# Patient Record
Sex: Female | Born: 1965 | Race: White | Hispanic: No | Smoking: Never smoker
Health system: Southern US, Community
[De-identification: ages and names within clinical notes are randomized; demographics above are authoritative.]

## PROBLEM LIST (undated history)

## (undated) DIAGNOSIS — D649 Anemia, unspecified: Secondary | ICD-10-CM

## (undated) DIAGNOSIS — J45909 Unspecified asthma, uncomplicated: Secondary | ICD-10-CM

## (undated) DIAGNOSIS — L209 Atopic dermatitis, unspecified: Secondary | ICD-10-CM

## (undated) DIAGNOSIS — R51 Headache: Secondary | ICD-10-CM

## (undated) DIAGNOSIS — T7840XA Allergy, unspecified, initial encounter: Secondary | ICD-10-CM

## (undated) DIAGNOSIS — Z8489 Family history of other specified conditions: Secondary | ICD-10-CM

## (undated) DIAGNOSIS — M1990 Unspecified osteoarthritis, unspecified site: Secondary | ICD-10-CM

## (undated) DIAGNOSIS — G4730 Sleep apnea, unspecified: Secondary | ICD-10-CM

## (undated) DIAGNOSIS — Z9981 Dependence on supplemental oxygen: Secondary | ICD-10-CM

## (undated) DIAGNOSIS — R519 Headache, unspecified: Secondary | ICD-10-CM

## (undated) DIAGNOSIS — I10 Essential (primary) hypertension: Secondary | ICD-10-CM

## (undated) DIAGNOSIS — U071 COVID-19: Secondary | ICD-10-CM

## (undated) HISTORY — DX: Morbid (severe) obesity due to excess calories: E66.01

## (undated) HISTORY — DX: Allergy, unspecified, initial encounter: T78.40XA

## (undated) HISTORY — DX: COVID-19: U07.1

## (undated) HISTORY — DX: Sleep apnea, unspecified: G47.30

## (undated) HISTORY — DX: Dependence on supplemental oxygen: Z99.81

## (undated) HISTORY — DX: Unspecified asthma, uncomplicated: J45.909

## (undated) HISTORY — DX: Unspecified osteoarthritis, unspecified site: M19.90

## (undated) HISTORY — DX: Atopic dermatitis, unspecified: L20.9

## (undated) HISTORY — DX: Anemia, unspecified: D64.9

## (undated) HISTORY — PX: IUD REMOVAL: SHX5392

---

## 2005-01-20 ENCOUNTER — Ambulatory Visit: Payer: Self-pay

## 2006-03-08 ENCOUNTER — Ambulatory Visit: Payer: Self-pay

## 2007-04-06 ENCOUNTER — Ambulatory Visit: Payer: Self-pay

## 2008-04-10 ENCOUNTER — Ambulatory Visit: Payer: Self-pay

## 2012-05-23 ENCOUNTER — Ambulatory Visit: Payer: Self-pay | Admitting: Specialist

## 2012-05-23 DIAGNOSIS — Z01810 Encounter for preprocedural cardiovascular examination: Secondary | ICD-10-CM

## 2012-05-23 LAB — CBC WITH DIFFERENTIAL/PLATELET
Basophil %: 0.8 %
HCT: 42.3 % (ref 35.0–47.0)
Lymphocyte #: 1.3 10*3/uL (ref 1.0–3.6)
Lymphocyte %: 15.3 %
MCH: 25.1 pg — ABNORMAL LOW (ref 26.0–34.0)
MCV: 80 fL (ref 80–100)
Monocyte #: 0.4 x10 3/mm (ref 0.2–0.9)
Neutrophil #: 6.8 10*3/uL — ABNORMAL HIGH (ref 1.4–6.5)
Neutrophil %: 77.8 %
Platelet: 209 10*3/uL (ref 150–440)
RDW: 17.6 % — ABNORMAL HIGH (ref 11.5–14.5)
WBC: 8.8 10*3/uL (ref 3.6–11.0)

## 2012-05-23 LAB — COMPREHENSIVE METABOLIC PANEL
Albumin: 4.2 g/dL (ref 3.4–5.0)
Alkaline Phosphatase: 133 U/L (ref 50–136)
Anion Gap: 7 (ref 7–16)
BUN: 18 mg/dL (ref 7–18)
Calcium, Total: 9 mg/dL (ref 8.5–10.1)
Chloride: 105 mmol/L (ref 98–107)
Creatinine: 0.66 mg/dL (ref 0.60–1.30)
Osmolality: 277 (ref 275–301)
Sodium: 138 mmol/L (ref 136–145)

## 2012-05-23 LAB — HEMOGLOBIN A1C: Hemoglobin A1C: 5.9 % (ref 4.2–6.3)

## 2012-05-23 LAB — MAGNESIUM: Magnesium: 1.8 mg/dL

## 2012-05-23 LAB — AMYLASE: Amylase: 25 U/L (ref 25–115)

## 2012-05-23 LAB — IRON AND TIBC
Iron Bind.Cap.(Total): 428 ug/dL (ref 250–450)
Iron: 44 ug/dL — ABNORMAL LOW (ref 50–170)

## 2012-05-23 LAB — BILIRUBIN, DIRECT: Bilirubin, Direct: 0.2 mg/dL (ref 0.00–0.20)

## 2012-05-23 LAB — PROTIME-INR: Prothrombin Time: 13.3 secs (ref 11.5–14.7)

## 2012-05-23 LAB — APTT: Activated PTT: 28.7 secs (ref 23.6–35.9)

## 2012-05-23 LAB — FERRITIN: Ferritin (ARMC): 27 ng/mL (ref 8–388)

## 2012-05-25 ENCOUNTER — Ambulatory Visit: Payer: Self-pay | Admitting: Specialist

## 2012-05-28 ENCOUNTER — Ambulatory Visit: Payer: Self-pay | Admitting: Specialist

## 2012-05-28 HISTORY — PX: CHOLECYSTECTOMY: SHX55

## 2012-07-20 ENCOUNTER — Ambulatory Visit: Payer: Self-pay | Admitting: Specialist

## 2012-07-28 ENCOUNTER — Ambulatory Visit: Payer: Self-pay | Admitting: Specialist

## 2012-09-05 ENCOUNTER — Ambulatory Visit: Payer: Self-pay | Admitting: Obstetrics and Gynecology

## 2012-11-28 DIAGNOSIS — E66813 Obesity, class 3: Secondary | ICD-10-CM

## 2012-11-28 HISTORY — DX: Morbid (severe) obesity due to excess calories: E66.01

## 2012-11-28 HISTORY — DX: Obesity, class 3: E66.813

## 2012-11-28 HISTORY — PX: GASTRIC BYPASS: SHX52

## 2012-12-05 DIAGNOSIS — K449 Diaphragmatic hernia without obstruction or gangrene: Secondary | ICD-10-CM | POA: Insufficient documentation

## 2012-12-05 DIAGNOSIS — G4733 Obstructive sleep apnea (adult) (pediatric): Secondary | ICD-10-CM | POA: Insufficient documentation

## 2013-01-09 ENCOUNTER — Ambulatory Visit: Payer: Self-pay

## 2013-01-16 ENCOUNTER — Ambulatory Visit: Payer: Self-pay | Admitting: Specialist

## 2013-01-28 ENCOUNTER — Ambulatory Visit: Payer: Self-pay | Admitting: Specialist

## 2013-05-09 ENCOUNTER — Other Ambulatory Visit: Payer: Self-pay | Admitting: Specialist

## 2013-05-09 LAB — COMPREHENSIVE METABOLIC PANEL
ALBUMIN: 3.6 g/dL (ref 3.4–5.0)
ALT: 25 U/L (ref 12–78)
AST: 14 U/L — AB (ref 15–37)
Alkaline Phosphatase: 117 U/L
Anion Gap: 5 — ABNORMAL LOW (ref 7–16)
BILIRUBIN TOTAL: 0.5 mg/dL (ref 0.2–1.0)
BUN: 24 mg/dL — ABNORMAL HIGH (ref 7–18)
Calcium, Total: 8.7 mg/dL (ref 8.5–10.1)
Chloride: 105 mmol/L (ref 98–107)
Co2: 30 mmol/L (ref 21–32)
Creatinine: 0.79 mg/dL (ref 0.60–1.30)
EGFR (African American): >60
EGFR (Non-African Amer.): >60
Glucose: 86 mg/dL (ref 65–99)
Osmolality: 282 (ref 275–301)
Potassium: 3.8 mmol/L (ref 3.5–5.1)
Sodium: 140 mmol/L (ref 136–145)
TOTAL PROTEIN: 7.9 g/dL (ref 6.4–8.2)

## 2013-05-09 LAB — CBC WITH DIFFERENTIAL/PLATELET
BASOS ABS: 0.1 10*3/uL (ref 0.0–0.1)
Basophil %: 1.1 %
EOS ABS: 0.1 10*3/uL (ref 0.0–0.7)
Eosinophil %: 1.2 %
HCT: 41.5 % (ref 35.0–47.0)
HGB: 13.4 g/dL (ref 12.0–16.0)
LYMPHS ABS: 2.7 10*3/uL (ref 1.0–3.6)
LYMPHS PCT: 24.8 %
MCH: 27.8 pg (ref 26.0–34.0)
MCHC: 32.3 g/dL (ref 32.0–36.0)
MCV: 86 fL (ref 80–100)
MONOS PCT: 3.6 %
Monocyte #: 0.4 x10 3/mm (ref 0.2–0.9)
NEUTROS ABS: 7.6 10*3/uL — AB (ref 1.4–6.5)
NEUTROS PCT: 69.3 %
Platelet: 180 10*3/uL (ref 150–440)
RBC: 4.83 10*6/uL (ref 3.80–5.20)
RDW: 15.3 % — ABNORMAL HIGH (ref 11.5–14.5)
WBC: 10.9 10*3/uL (ref 3.6–11.0)

## 2013-05-09 LAB — PHOSPHORUS: PHOSPHORUS: 3.9 mg/dL (ref 2.5–4.9)

## 2013-05-09 LAB — FERRITIN: Ferritin (ARMC): 50 ng/mL (ref 8–388)

## 2013-05-09 LAB — FOLATE: Folic Acid: 5.7 ng/mL (ref 3.1–100.0)

## 2013-05-09 LAB — IRON: Iron: 31 ug/dL — ABNORMAL LOW (ref 50–170)

## 2013-05-09 LAB — MAGNESIUM: Magnesium: 2 mg/dL

## 2013-05-09 LAB — AMYLASE: Amylase: 30 U/L (ref 25–115)

## 2013-09-11 ENCOUNTER — Ambulatory Visit: Payer: Self-pay | Admitting: Family Medicine

## 2014-02-16 ENCOUNTER — Encounter: Payer: Self-pay | Admitting: Podiatry

## 2014-02-16 ENCOUNTER — Ambulatory Visit (INDEPENDENT_AMBULATORY_CARE_PROVIDER_SITE_OTHER): Payer: BC Managed Care – PPO | Admitting: Podiatry

## 2014-02-16 VITALS — BP 124/77 | HR 74 | Resp 16 | Ht 62.5 in | Wt 263.0 lb

## 2014-02-16 DIAGNOSIS — L84 Corns and callosities: Secondary | ICD-10-CM

## 2014-02-16 DIAGNOSIS — M779 Enthesopathy, unspecified: Secondary | ICD-10-CM

## 2014-02-16 MED ORDER — TRIAMCINOLONE ACETONIDE 10 MG/ML IJ SUSP
10.0000 mg | Freq: Once | INTRAMUSCULAR | Status: AC
  Administered 2014-02-16: 10 mg

## 2014-02-16 NOTE — Progress Notes (Signed)
   Subjective:    Patient ID: Claudia Hoffman, female    DOB: Dec 20, 1965, 48 y.o.   MRN: [id number]  HPI Comments: Spot on the bottom of the foot , 5th met , it burns and stings when walking on it   Foot Pain      Review of Systems  All other systems reviewed and are negative.      Objective:   Physical Exam        Assessment & Plan:

## 2014-02-17 NOTE — Progress Notes (Signed)
Subjective:     Patient ID: Claudia Hoffman, female   DOB: 03-07-1966, 48 y.o.   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      Objective:   Physical Exam  Constitutional: She is oriented to person, place, and time.  Cardiovascular: Intact distal pulses.   Musculoskeletal: Normal range of motion.  Neurological: She is oriented to person, place, and time.  Skin: Skin is warm.  Nursing note and vitals reviewed.  neurovascular status intact with muscle strength adequate and range of motion within normal limits subtalar and midtarsal joint. Patient is noted to have a keratotic lesion distal plantar aspect right fifth metatarsal that's painful when pressed with a lucent-type core     Assessment:     Probable porokeratotic lesion right with inflammatory capsulitis occurring around the fifth MPJ    Plan:     H&P and condition reviewed. Did a careful plantar steroid injection 3 mg Kenalog 5 mg Xylocaine and then did deep debridement of lesion which was tolerated well. Reappoint to recheck if symptoms persist

## 2014-02-26 ENCOUNTER — Ambulatory Visit: Payer: Self-pay | Admitting: Podiatry

## 2014-08-08 ENCOUNTER — Ambulatory Visit: Payer: Self-pay | Admitting: Podiatry

## 2014-08-10 ENCOUNTER — Ambulatory Visit (INDEPENDENT_AMBULATORY_CARE_PROVIDER_SITE_OTHER): Payer: BLUE CROSS/BLUE SHIELD

## 2014-08-10 ENCOUNTER — Ambulatory Visit (INDEPENDENT_AMBULATORY_CARE_PROVIDER_SITE_OTHER): Payer: BLUE CROSS/BLUE SHIELD | Admitting: Podiatry

## 2014-08-10 VITALS — BP 156/95 | HR 63 | Resp 16

## 2014-08-10 DIAGNOSIS — M722 Plantar fascial fibromatosis: Secondary | ICD-10-CM | POA: Diagnosis not present

## 2014-08-10 MED ORDER — TRIAMCINOLONE ACETONIDE 10 MG/ML IJ SUSP
10.0000 mg | Freq: Once | INTRAMUSCULAR | Status: AC
  Administered 2014-08-10: 10 mg

## 2014-08-12 NOTE — Progress Notes (Signed)
Subjective:     Patient ID: Claudia Hoffman, female   DOB: [birth year], 49 y.o.   MRN: [id number]  HPI patient presents stating I'm having pain in my left heel   Review of Systems     Objective:   Physical Exam Neurovascular status intact muscle strength adequate with range of motion within normal limits. Patient's noted to have exquisite discomfort plantar aspect left at the insertional point of the tendon into the calcaneus    Assessment:     Plantar fasciitis of the left heel at this current time of an acute nature    Plan:     H&P and x-rays reviewed. Injected the left plantar fascia 3 mg Kenalog 5 mg Xylocaine and applied fascial brace with instructions on usage and instructed on supportive shoe gear usage area reappoint 2 weeks

## 2014-08-15 ENCOUNTER — Other Ambulatory Visit: Payer: Self-pay | Admitting: Obstetrics and Gynecology

## 2014-08-15 DIAGNOSIS — Z1231 Encounter for screening mammogram for malignant neoplasm of breast: Secondary | ICD-10-CM

## 2014-08-15 LAB — HM PAP SMEAR: HM Pap smear: NEGATIVE

## 2014-08-24 ENCOUNTER — Ambulatory Visit (INDEPENDENT_AMBULATORY_CARE_PROVIDER_SITE_OTHER): Payer: BLUE CROSS/BLUE SHIELD | Admitting: Podiatry

## 2014-08-24 DIAGNOSIS — M722 Plantar fascial fibromatosis: Secondary | ICD-10-CM

## 2014-08-24 MED ORDER — PREDNISONE 10 MG PO TABS: ORAL_TABLET | ORAL | Status: DC

## 2014-08-24 NOTE — Progress Notes (Signed)
Subjective:     Patient ID: Claudia Hoffman, female   DOB: 1966-03-26, 49 y.o.   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 Obesity is complicating factor    Assessment:     Acute lateral band plantar fasciitis left with weightbearing and obesity is complicating factors    Plan:     Applied air fracture walker to reduce all plantar pressures and at this time scanned for custom orthotics to try to reduce stress against the joint surface. Reappoint for [place] to recheck

## 2014-09-07 ENCOUNTER — Ambulatory Visit (INDEPENDENT_AMBULATORY_CARE_PROVIDER_SITE_OTHER): Payer: BLUE CROSS/BLUE SHIELD | Admitting: Podiatry

## 2014-09-07 DIAGNOSIS — M722 Plantar fascial fibromatosis: Secondary | ICD-10-CM

## 2014-09-07 NOTE — Patient Instructions (Signed)

## 2014-09-07 NOTE — Progress Notes (Signed)
Subjective:     Patient ID: Claudia Hoffman, female   DOB: 1965/08/25, 49 y.o.   MRN: [id number]  HPI patient presents stating I'm still having pain in the outside of my left heel but I think it might be slightly better. 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 The skin itself is no longer sensitive it has to be palpated deeper into the tissue    Assessment:     Lateral band plantar fasciitis left which has remained resistant so far to conservative care    Plan:     Dispensed orthotics with instructions and instructed on not using ice on the area and continued immobilization. Discussed possibility for shockwave therapy and reappoint in 4 weeks to reevaluate

## 2014-10-08 ENCOUNTER — Ambulatory Visit: Payer: BLUE CROSS/BLUE SHIELD | Admitting: Podiatry

## 2014-10-08 ENCOUNTER — Ambulatory Visit (INDEPENDENT_AMBULATORY_CARE_PROVIDER_SITE_OTHER): Payer: BLUE CROSS/BLUE SHIELD | Admitting: Family Medicine

## 2014-10-08 ENCOUNTER — Encounter: Payer: Self-pay | Admitting: Family Medicine

## 2014-10-08 VITALS — BP 122/76 | HR 85 | Temp 97.9°F | Resp 18 | Ht 62.0 in | Wt 308.1 lb

## 2014-10-08 DIAGNOSIS — G8929 Other chronic pain: Secondary | ICD-10-CM | POA: Diagnosis not present

## 2014-10-08 DIAGNOSIS — Z13220 Encounter for screening for lipoid disorders: Secondary | ICD-10-CM | POA: Diagnosis not present

## 2014-10-08 DIAGNOSIS — Z6841 Body Mass Index (BMI) 40.0 and over, adult: Secondary | ICD-10-CM | POA: Diagnosis not present

## 2014-10-08 DIAGNOSIS — M2550 Pain in unspecified joint: Secondary | ICD-10-CM

## 2014-10-08 DIAGNOSIS — M722 Plantar fascial fibromatosis: Secondary | ICD-10-CM | POA: Diagnosis not present

## 2014-10-08 DIAGNOSIS — D508 Other iron deficiency anemias: Secondary | ICD-10-CM

## 2014-10-08 DIAGNOSIS — G43009 Migraine without aura, not intractable, without status migrainosus: Secondary | ICD-10-CM | POA: Insufficient documentation

## 2014-10-08 DIAGNOSIS — Z9884 Bariatric surgery status: Secondary | ICD-10-CM

## 2014-10-08 DIAGNOSIS — K219 Gastro-esophageal reflux disease without esophagitis: Secondary | ICD-10-CM | POA: Insufficient documentation

## 2014-10-08 MED ORDER — LORCASERIN HCL 10 MG PO TABS
1.0000 | ORAL_TABLET | Freq: Two times a day (BID) | ORAL | Status: DC
Start: 2014-10-08 — End: 2016-01-20

## 2014-10-08 MED ORDER — CELECOXIB 100 MG PO CAPS: 100.0000 mg | ORAL_CAPSULE | Freq: Two times a day (BID) | ORAL | Status: DC

## 2014-10-08 MED ORDER — HYDROCODONE-ACETAMINOPHEN 5-325 MG PO TABS: 1.0000 | ORAL_TABLET | Freq: Two times a day (BID) | ORAL | Status: DC | PRN

## 2014-10-08 NOTE — Progress Notes (Signed)
Name: Claudia Hoffman   MRN: [id number]    DOB: [birth year]   Date:10/08/2014       Progress Note  Subjective  Chief Complaint  Chief Complaint  Patient presents with  . Arthritis    patient is here for a follow-up. Sx has not improved and are worsening. Patient questions if she has fibromyalgia.  . Migraine    patient is here for a follow-up  . Medication Refill    HPI  Joint/Muscle Pain: Patient complains of arthralgias and myalgias for which has been present for several years. Pain is located in multiple joints including shoulders, wrists, hands, bilateral knees and feet. Pain is described as aching and throbbing, and is constant .  Associated symptoms include: decreased range of motion.  The patient has tried cold, eating, heat, NSAIDs and rest for pain, with minimal relief.  Related to injury:   No. 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 With her bariatric surgery status (sleeve) she is avoiding excessive NSAID use. Has tried Meloxicam with minimal benefit in the past. Using her hydrocodone-acetoaminophen sparingly. Is considering autoimmune disorder as her mother has psoriatic arthritis. Ready to do blood work.  Obesity: Patient complains of obesity. Patient cites health, increased physical ability, self-image as reasons for wanting to lose weight. She understands that her joint issues may be related to her excessive weight. She has gained nearly 50lbs since her last visit 1 year ago. She has a hard time with emotional eating and grazes all day on unhealthy snacks and does not exercise as she should.      Patient Active Problem List   Diagnosis Date Noted  . Extreme obesity 12/05/2012  . Obstructive sleep apnea of adult 12/05/2012  . Sliding hiatal hernia 12/05/2012    History  Substance Use Topics  . Smoking status: Never Smoker   . Smokeless tobacco: Not on file  . Alcohol Use: No     Comment: was a moderate alcohol user, but has not used  in 92yrs     Current outpatient prescriptions:  .  levonorgestrel (MIRENA) 20 MCG/24HR IUD, by Intrauterine route., Disp: , Rfl:  .  b complex vitamins capsule, Take 1 capsule by mouth daily., Disp: , Rfl:  .  calcium & magnesium carbonates (MYLANTA) 311-232 MG per tablet, Take 1 tablet by mouth daily., Disp: , Rfl:  .  Iron-Vit C-Vit B12-Folic Acid (IRON 100 PLUS PO), Take by mouth., Disp: , Rfl:  .  Multiple Vitamins-Minerals (MULTIVITAMIN ADULT PO), Take by mouth., Disp: , Rfl:  .  predniSONE (DELTASONE) 10 MG tablet, 12 day tapering dose (Patient not taking: Reported on 10/08/2014), Disp: 48 tablet, Rfl: 0 .  Vitamin D, Ergocalciferol, (DRISDOL) 50000 UNITS CAPS capsule, Take by mouth., Disp: , Rfl:   Past Surgical History  Procedure Laterality Date  . Cholecystectomy  05/2012  . Gastric bypass  11/2012    sleeve    Family History  Problem Relation Age of Onset  . Hypertension Mother   . Hypertension Father   . Heart disease Father     No Known Allergies   Review of Systems  CONSTITUTIONAL: Yes significant weight changes, gain. No fever, chills, weakness or fatigue.  HEENT:  - Eyes: No visual changes.  - Ears: No auditory changes. No pain.  - Nose: No sneezing, congestion, runny nose. - Throat: No sore throat. No changes in swallowing. SKIN: No rash or itching.  CARDIOVASCULAR: No chest pain,  chest pressure or chest discomfort. No palpitations or edema.  RESPIRATORY: No shortness of breath, cough or sputum.  GASTROINTESTINAL: No anorexia, nausea, vomiting. No changes in bowel habits. No abdominal pain or blood.  GENITOURINARY: No dysuria. No frequency. No discharge.  NEUROLOGICAL: No headache, dizziness, syncope, paralysis, ataxia, numbness or tingling in the extremities. No memory changes. No change in bowel or bladder control.  MUSCULOSKELETAL: Yes joint pain. No muscle pain. HEMATOLOGIC: No anemia, bleeding or bruising.  LYMPHATICS: No enlarged lymph nodes.   PSYCHIATRIC: No change in mood. No change in sleep pattern.  ENDOCRINOLOGIC: No reports of sweating, cold or heat intolerance. No polyuria or polydipsia.      Objective  BP 122/76 mmHg  Pulse 85  Temp(Src) 97.9 F (36.6 C) (Oral)  Resp 18  Ht 5\' 2"  (1.575 m)  Wt 308 lb 1.6 oz (139.753 kg)  BMI 56.34 kg/m2  SpO2 96% Body mass index is 56.34 kg/(m^2).  Physical Exam  Constitutional: Patient is morbidly obese and well-nourished. In no distress.  HEENT:  - Head: Normocephalic and atraumatic.  - Ears: Bilateral TMs gray, no erythema or effusion - Nose: Nasal mucosa moist - Mouth/Throat: Oropharynx is clear and moist. No tonsillar hypertrophy or erythema. No post nasal drainage.  - Eyes: Conjunctivae clear, EOM movements normal. PERRLA. No scleral icterus.  Neck: Normal range of motion. Neck supple. No JVD present. No thyromegaly present.  Cardiovascular: Normal rate, regular rhythm and normal heart sounds.  No murmur heard.  Pulmonary/Chest: Effort normal and breath sounds normal. No respiratory distress. Musculoskeletal: Normal range of motion bilateral UE and LE, no joint effusions. Left knee medial aspect mild effusion compared to right knee. Bilateral hands and wrists no swelling at MCP/PIP/DIP joints.  Peripheral vascular: Bilateral LE no edema. Neurological: CN II-XII grossly intact with no focal deficits. Alert and oriented to person, place, and time. Coordination, balance, strength, speech and gait are normal.  Skin: Skin is warm and dry. No rash noted. No erythema.  Psychiatric: Patient has a normal mood and affect. Behavior is normal in office today. Judgment and thought content normal in office today.   Assessment & Plan  1. Chronic pain of multiple joints Trial of Celebrex along with prn opioid. Will rule out any autoimmune or rheumatological disorder with blood work.  - celecoxib (CELEBREX) 100 MG capsule; Take 1 capsule (100 mg total) by mouth 2 (two) times  daily.  Dispense: 60 capsule; Refill: 2 - HYDROcodone-acetaminophen (NORCO/VICODIN) 5-325 MG per tablet; Take 1 tablet by mouth 2 (two) times daily as needed for severe pain.  Dispense: 60 tablet; Refill: 0 - CBC with Differential/Platelet - Comprehensive metabolic panel - Lipid panel - Rheumatoid factor - Cyclic citrul peptide antibody, IgG - Sedimentation rate - Ferritin - Iron - Iron and TIBC  2. Status post bariatric surgery Blood work. Encouraged patient to touch base with bariatric group to discuss long term goals for weight loss and control.  - CBC with Differential/Platelet - Comprehensive metabolic panel - Lipid panel - Rheumatoid factor - Cyclic citrul peptide antibody, IgG - Sedimentation rate - Ferritin - Iron - Iron and TIBC  3. Iron deficiency anemia due to dietary causes Blood work.  - CBC with Differential/Platelet - Comprehensive metabolic panel - Lipid panel - Rheumatoid factor - Cyclic citrul peptide antibody, IgG - Sedimentation rate - Ferritin - Iron - Iron and TIBC  4. Screening cholesterol level Blood work.  - Lipid panel  5. Morbid obesity with BMI of 50.0-59.9, adult The  patient has been counseled on their higher than normal BMI.  They have verbally expressed understanding their increased risk for other diseases.  In efforts to meet a better target BMI goal the patient has been counseled on lifestyle, diet and exercise modification tactics. Start with moderate intensity aerobic exercise (walking, jogging, elliptical, swimming, group or individual sports, hiking) at least a day at least 4 days a week and increase intensity, duration, frequency as tolerated. Diet should include well balance fresh fruits and vegetables avoiding processed foods, carbohydrates and sugars. Drink at least 8oz 10 glasses a day avoiding sodas, sugary fruit drinks, sweetened tea. Check weight on a reliable scale daily and monitor weight loss progress daily. Consider  investing in mobile phone apps that will help keep track of weight loss goals.   - Lorcaserin HCl (BELVIQ) 10 MG TABS; Take 1 tablet by mouth 2 (two) times daily.  Dispense: 60 tablet; Refill: 2 - CBC with Differential/Platelet - Comprehensive metabolic panel - Lipid panel - TSH - Rheumatoid factor - Cyclic citrul peptide antibody, IgG - Sedimentation rate - Ferritin - Iron - Iron and TIBC

## 2014-10-08 NOTE — Patient Instructions (Signed)
Fat and Cholesterol Control Diet Fat and cholesterol levels in your blood and organs are influenced by your diet. High levels of fat and cholesterol may lead to diseases of the heart, small and large blood vessels, gallbladder, liver, and pancreas. CONTROLLING FAT AND CHOLESTEROL WITH DIET Although exercise and lifestyle factors are important, your diet is key. That is because certain foods are known to raise cholesterol and others to lower it. The goal is to balance foods for their effect on cholesterol and more importantly, to replace saturated and trans fat with other types of fat, such as monounsaturated fat, polyunsaturated fat, and omega-3 fatty acids. On average, a person should consume no more than 15 to 17 g of saturated fat daily. Saturated and trans fats are considered "bad" fats, and they will raise LDL cholesterol. Saturated fats are primarily found in animal products such as meats, butter, and cream. However, that does not mean you need to give up all your favorite foods. Today, there are good tasting, low-fat, low-cholesterol substitutes for most of the things you like to eat. Choose low-fat or nonfat alternatives. Choose round or loin cuts of red meat. These types of cuts are lowest in fat and cholesterol. Chicken (without the skin), fish, veal, and ground turkey breast are great choices. Eliminate fatty meats, such as hot dogs and salami. Even shellfish have little or no saturated fat. Have a 3 oz (85 g) portion when you eat lean meat, poultry, or fish. Trans fats are also called "partially hydrogenated oils." They are oils that have been scientifically manipulated so that they are solid at room temperature resulting in a longer shelf life and improved taste and texture of foods in which they are added. Trans fats are found in stick margarine, some tub margarines, cookies, crackers, and baked goods.  When baking and cooking, oils are a great substitute for butter. The monounsaturated oils are  especially beneficial since it is believed they lower LDL and raise HDL. The oils you should avoid entirely are saturated tropical oils, such as coconut and palm.  Remember to eat a lot from food groups that are naturally free of saturated and trans fat, including fish, fruit, vegetables, beans, grains (barley, rice, couscous, bulgur wheat), and pasta (without cream sauces).  IDENTIFYING FOODS THAT LOWER FAT AND CHOLESTEROL  Soluble fiber may lower your cholesterol. This type of fiber is found in fruits such as apples, vegetables such as broccoli, potatoes, and carrots, legumes such as beans, peas, and lentils, and grains such as barley. Foods fortified with plant sterols (phytosterol) may also lower cholesterol. You should eat at least 2 g per day of these foods for a cholesterol lowering effect.  Read package labels to identify low-saturated fats, trans fat free, and low-fat foods at the supermarket. Select cheeses that have only 2 to 3 g saturated fat per ounce. Use a heart-healthy tub margarine that is free of trans fats or partially hydrogenated oil. When buying baked goods (cookies, crackers), avoid partially hydrogenated oils. Breads and muffins should be made from whole grains (whole-wheat or whole oat flour, instead of "flour" or "enriched flour"). Buy non-creamy canned soups with reduced salt and no added fats.  FOOD PREPARATION TECHNIQUES  Never deep-fry. If you must fry, either stir-fry, which uses very little fat, or use non-stick cooking sprays. When possible, broil, bake, or roast meats, and steam vegetables. Instead of putting butter or margarine on vegetables, use lemon and herbs, applesauce, and cinnamon (for squash and sweet potatoes). Use nonfat   yogurt, salsa, and low-fat dressings for salads.  LOW-SATURATED FAT / LOW-FAT FOOD SUBSTITUTES Meats / Saturated Fat (g)  Avoid: Steak, marbled (3 oz/85 g) / 11 g  Choose: Steak, lean (3 oz/85 g) / 4 g  Avoid: Hamburger (3 oz/85 g) / 7  g  Choose: Hamburger, lean (3 oz/85 g) / 5 g  Avoid: Ham (3 oz/85 g) / 6 g  Choose: Ham, lean cut (3 oz/85 g) / 2.4 g  Avoid: Chicken, with skin, dark meat (3 oz/85 g) / 4 g  Choose: Chicken, skin removed, dark meat (3 oz/85 g) / 2 g  Avoid: Chicken, with skin, light meat (3 oz/85 g) / 2.5 g  Choose: Chicken, skin removed, light meat (3 oz/85 g) / 1 g Dairy / Saturated Fat (g)  Avoid: Whole milk (1 cup) / 5 g  Choose: Low-fat milk, 2% (1 cup) / 3 g  Choose: Low-fat milk, 1% (1 cup) / 1.5 g  Choose: Skim milk (1 cup) / 0.3 g  Avoid: Hard cheese (1 oz/28 g) / 6 g  Choose: Skim milk cheese (1 oz/28 g) / 2 to 3 g  Avoid: Cottage cheese, 4% fat (1 cup) / 6.5 g  Choose: Low-fat cottage cheese, 1% fat (1 cup) / 1.5 g  Avoid: Ice cream (1 cup) / 9 g  Choose: Sherbet (1 cup) / 2.5 g  Choose: Nonfat frozen yogurt (1 cup) / 0.3 g  Choose: Frozen fruit bar / trace  Avoid: Whipped cream (1 tbs) / 3.5 g  Choose: Nondairy whipped topping (1 tbs) / 1 g Condiments / Saturated Fat (g)  Avoid: Mayonnaise (1 tbs) / 2 g  Choose: Low-fat mayonnaise (1 tbs) / 1 g  Avoid: Butter (1 tbs) / 7 g  Choose: Extra light margarine (1 tbs) / 1 g  Avoid: Coconut oil (1 tbs) / 11.8 g  Choose: Olive oil (1 tbs) / 1.8 g  Choose: Corn oil (1 tbs) / 1.7 g  Choose: Safflower oil (1 tbs) / 1.2 g  Choose: Sunflower oil (1 tbs) / 1.4 g  Choose: Soybean oil (1 tbs) / 2.4 g  Choose: Canola oil (1 tbs) / 1 g Document Released: 03/16/2005 Document Revised: 07/11/2012 Document Reviewed: 06/14/2013 [employer] Patient Information 2015 [employer]. This information is not intended to replace advice given to you by your health care provider. Make sure you discuss any questions you have with your health care provider.  

## 2014-10-09 LAB — CBC WITH DIFFERENTIAL/PLATELET
BASOS: 1 %
Basophils Absolute: 0 10*3/uL (ref 0.0–0.2)
EOS (ABSOLUTE): 0.1 10*3/uL (ref 0.0–0.4)
Eos: 1 %
Hematocrit: 42.6 % (ref 34.0–46.6)
Hemoglobin: 13.7 g/dL (ref 11.1–15.9)
IMMATURE GRANS (ABS): 0 10*3/uL (ref 0.0–0.1)
IMMATURE GRANULOCYTES: 0 %
Lymphocytes Absolute: 1.6 10*3/uL (ref 0.7–3.1)
Lymphs: 23 %
MCH: 28.2 pg (ref 26.6–33.0)
MCHC: 32.2 g/dL (ref 31.5–35.7)
MCV: 88 fL (ref 79–97)
MONOCYTES: 6 %
MONOS ABS: 0.4 10*3/uL (ref 0.1–0.9)
Neutrophils Absolute: 4.7 10*3/uL (ref 1.4–7.0)
Neutrophils: 69 %
Platelets: 183 10*3/uL (ref 150–379)
RBC: 4.86 x10E6/uL (ref 3.77–5.28)
RDW: 14.7 % (ref 12.3–15.4)
WBC: 6.9 10*3/uL (ref 3.4–10.8)

## 2014-10-09 LAB — FERRITIN: Ferritin: 64 ng/mL (ref 15–150)

## 2014-10-09 LAB — COMPREHENSIVE METABOLIC PANEL
ALBUMIN: 4 g/dL (ref 3.5–5.5)
ALT: 14 IU/L (ref 0–32)
AST: 18 IU/L (ref 0–40)
Albumin/Globulin Ratio: 1.6 (ref 1.1–2.5)
Alkaline Phosphatase: 89 IU/L (ref 39–117)
BILIRUBIN TOTAL: 0.4 mg/dL (ref 0.0–1.2)
BUN / CREAT RATIO: 22 (ref 9–23)
BUN: 15 mg/dL (ref 6–24)
CALCIUM: 8.8 mg/dL (ref 8.7–10.2)
CO2: 23 mmol/L (ref 18–29)
Chloride: 103 mmol/L (ref 97–108)
Creatinine, Ser: 0.68 mg/dL (ref 0.57–1.00)
GFR calc Af Amer: 119 mL/min/{1.73_m2} (ref >59)
GFR calc non Af Amer: 103 mL/min/{1.73_m2} (ref >59)
GLUCOSE: 94 mg/dL (ref 65–99)
Globulin, Total: 2.5 g/dL (ref 1.5–4.5)
POTASSIUM: 4.2 mmol/L (ref 3.5–5.2)
Sodium: 144 mmol/L (ref 134–144)
Total Protein: 6.5 g/dL (ref 6.0–8.5)

## 2014-10-09 LAB — TSH: TSH: 2.18 u[IU]/mL (ref 0.450–4.500)

## 2014-10-09 LAB — SEDIMENTATION RATE: Sed Rate: 23 mm/hr (ref 0–32)

## 2014-10-09 LAB — IRON AND TIBC
IRON SATURATION: 18 % (ref 15–55)
Iron: 51 ug/dL (ref 27–159)
Total Iron Binding Capacity: 287 ug/dL (ref 250–450)
UIBC: 236 ug/dL (ref 131–425)

## 2014-10-09 LAB — RHEUMATOID FACTOR: Rhuematoid fact SerPl-aCnc: 13.1 IU/mL (ref 0.0–13.9)

## 2014-10-09 LAB — LIPID PANEL
CHOL/HDL RATIO: 3.7 ratio (ref 0.0–4.4)
Cholesterol, Total: 170 mg/dL (ref 100–199)
HDL: 46 mg/dL (ref >39)
LDL CALC: 90 mg/dL (ref 0–99)
TRIGLYCERIDES: 172 mg/dL — AB (ref 0–149)
VLDL Cholesterol Cal: 34 mg/dL (ref 5–40)

## 2014-10-10 LAB — CYCLIC CITRUL PEPTIDE ANTIBODY, IGG/IGA: Cyclic Citrullin Peptide Ab: 7 units (ref 0–19)

## 2014-10-22 ENCOUNTER — Ambulatory Visit: Payer: BLUE CROSS/BLUE SHIELD | Admitting: Podiatry

## 2014-11-05 ENCOUNTER — Ambulatory Visit (INDEPENDENT_AMBULATORY_CARE_PROVIDER_SITE_OTHER): Payer: BLUE CROSS/BLUE SHIELD | Admitting: Podiatry

## 2014-11-05 ENCOUNTER — Encounter: Payer: Self-pay | Admitting: Podiatry

## 2014-11-05 ENCOUNTER — Ambulatory Visit (INDEPENDENT_AMBULATORY_CARE_PROVIDER_SITE_OTHER): Payer: BLUE CROSS/BLUE SHIELD

## 2014-11-05 VITALS — BP 156/90 | HR 65 | Resp 16

## 2014-11-05 DIAGNOSIS — M722 Plantar fascial fibromatosis: Secondary | ICD-10-CM

## 2014-11-05 DIAGNOSIS — M779 Enthesopathy, unspecified: Secondary | ICD-10-CM | POA: Diagnosis not present

## 2014-11-05 DIAGNOSIS — M6740 Ganglion, unspecified site: Secondary | ICD-10-CM | POA: Diagnosis not present

## 2014-11-05 MED ORDER — TRIAMCINOLONE ACETONIDE 10 MG/ML IJ SUSP
10.0000 mg | Freq: Once | INTRAMUSCULAR | Status: AC
  Administered 2014-11-05: 10 mg

## 2014-11-05 MED ORDER — PREDNISONE 10 MG PO TABS: ORAL_TABLET | ORAL | Status: DC

## 2014-11-05 NOTE — Progress Notes (Signed)
Subjective:     Patient ID: Claudia Hoffman, female   DOB: 1965-08-07, 49 y.o.   MRN: [id number]  HPI patient presents stating my left foot is really hurting me on top now and the heel seems to be quite a bit better but it's very tender on top and then I do have this cyst on my right inside heel that I wanted you to check   Review of Systems     Objective:   Physical Exam Neurovascular status intact muscle strength was adequate range of motion within normal limits with forefoot pain left which is mostly in the third intermetatarsal space and extending in a distal direction with mild discomfort in the metatarsophalangeal joints and a enlargement on the right medial heel measuring about 1.3 x 1.3 cm which appears to be freely movable within subcutaneous tissue    Assessment:     Separate distinct problems with possibility for cyst formation right and inflammatory forefoot capsulitis left secondary to gait change from heel and the fact the patient does have extreme obesity condition    Plan:     Reviewed x-rays left foot ruling out fracture and did a forefoot injection 3 mg Kenalog 5 mg Xylocaine. Then discussed the cyst on the right which may ultimately removed but not at the current time

## 2014-11-12 ENCOUNTER — Ambulatory Visit
Admission: RE | Admit: 2014-11-12 | Discharge: 2014-11-12 | Disposition: A | Discharge status: Home / Self Care | Payer: BLUE CROSS/BLUE SHIELD | Source: Ambulatory Visit | Attending: Obstetrics and Gynecology | Admitting: Obstetrics and Gynecology

## 2014-11-12 ENCOUNTER — Other Ambulatory Visit: Payer: Self-pay | Admitting: Obstetrics and Gynecology

## 2014-11-12 ENCOUNTER — Ambulatory Visit (INDEPENDENT_AMBULATORY_CARE_PROVIDER_SITE_OTHER): Payer: BLUE CROSS/BLUE SHIELD | Admitting: Podiatry

## 2014-11-12 ENCOUNTER — Encounter: Payer: Self-pay | Admitting: Podiatry

## 2014-11-12 VITALS — BP 138/67 | HR 84 | Resp 16

## 2014-11-12 DIAGNOSIS — N6489 Other specified disorders of breast: Secondary | ICD-10-CM

## 2014-11-12 DIAGNOSIS — Z1231 Encounter for screening mammogram for malignant neoplasm of breast: Secondary | ICD-10-CM | POA: Insufficient documentation

## 2014-11-12 DIAGNOSIS — R928 Other abnormal and inconclusive findings on diagnostic imaging of breast: Secondary | ICD-10-CM

## 2014-11-12 DIAGNOSIS — M6740 Ganglion, unspecified site: Secondary | ICD-10-CM | POA: Diagnosis not present

## 2014-11-12 DIAGNOSIS — M722 Plantar fascial fibromatosis: Secondary | ICD-10-CM | POA: Diagnosis not present

## 2014-11-14 ENCOUNTER — Telehealth: Payer: Self-pay | Admitting: *Deleted

## 2014-11-14 NOTE — Telephone Encounter (Signed)
-----   Message from Ulyses Amor, [place] sent at 11/12/2014 11:20 AM EDT ----- Please let her know i have reviewed MMG and nothing too worrisome, but need more images in left breast. I have put orders in.  Probable shadow, but need to make sure.

## 2014-11-14 NOTE — Telephone Encounter (Signed)
Notified pt of mammo results, she was worried asked for [facility] to call her

## 2014-11-14 NOTE — Progress Notes (Signed)
Subjective:     Patient ID: Claudia Hoffman, female   DOB: 05-08-1965, 49 y.o.   MRN: [id number]  HPI patient states my left foot seems quite a bit better at this time but still can be sore if I been on it a lot. Morbid obesity is noted   Review of Systems     Objective:   Physical Exam  neurovascular status intact with  Reduction of discomfort in the left forefoot and in the left heel with mild pain upon deep palpation to the forefoot    Assessment:      improvement of symptoms left gradually as the plantar fasciitis has continued to improve    Plan:      advised on physical therapy supportive shoe gear usage and continued orthotics and patient is discharged and less symptoms were to persist

## 2014-11-15 ENCOUNTER — Telehealth: Payer: Self-pay

## 2014-11-15 NOTE — Telephone Encounter (Signed)
Several attempts have been made to contact patient. No voice mail is set on phone.

## 2014-11-28 ENCOUNTER — Ambulatory Visit
Admission: RE | Admit: 2014-11-28 | Discharge: 2014-11-28 | Disposition: A | Discharge status: Home / Self Care | Payer: BLUE CROSS/BLUE SHIELD | Source: Ambulatory Visit | Attending: Obstetrics and Gynecology | Admitting: Obstetrics and Gynecology

## 2014-11-28 DIAGNOSIS — R928 Other abnormal and inconclusive findings on diagnostic imaging of breast: Secondary | ICD-10-CM | POA: Insufficient documentation

## 2014-12-28 ENCOUNTER — Other Ambulatory Visit: Payer: Self-pay | Admitting: *Deleted

## 2015-01-14 ENCOUNTER — Ambulatory Visit: Payer: BLUE CROSS/BLUE SHIELD | Admitting: Family Medicine

## 2015-08-14 ENCOUNTER — Encounter: Payer: Self-pay | Admitting: *Deleted

## 2015-08-19 ENCOUNTER — Encounter: Payer: Self-pay | Admitting: Obstetrics and Gynecology

## 2015-08-19 ENCOUNTER — Ambulatory Visit (INDEPENDENT_AMBULATORY_CARE_PROVIDER_SITE_OTHER): Payer: BLUE CROSS/BLUE SHIELD | Admitting: Obstetrics and Gynecology

## 2015-08-19 VITALS — BP 141/77 | HR 73 | Ht 63.0 in | Wt 353.4 lb

## 2015-08-19 DIAGNOSIS — Z01419 Encounter for gynecological examination (general) (routine) without abnormal findings: Secondary | ICD-10-CM | POA: Diagnosis not present

## 2015-08-19 NOTE — Patient Instructions (Signed)
Place annual gynecologic exam patient instructions here.

## 2015-08-19 NOTE — Progress Notes (Signed)
Subjective:   Claudia Hoffman is a 50 y.o. G61P0010 Caucasian female here for a routine well-woman exam.  No LMP recorded. Patient is not currently having periods (Reason: IUD).    Current complaints: weight gain PCP: Sudaram       Doesn't need labs  Social History: Sexual: heterosexual Marital Status: divorced Living situation: with daughter Occupation: self-employed Tobacco/alcohol: no tobacco use Illicit drugs: no history of illicit drug use  The following portions of the patient's history were reviewed and updated as appropriate: allergies, current medications, past family history, past medical history, past social history, past surgical history and problem list.  Past Medical History Past Medical History  Diagnosis Date  . Allergy   . Sleep apnea   . Asthma   . GERD (gastroesophageal reflux disease)   . Arthritis   . Iron deficiency   . Obesity, Class III, BMI 40-49.9 (morbid obesity) (HCC) 11/2012    s/p bariatric sleeve surgery by Dr. Smitty Cords   . Atopic dermatitis   . Anemia     Past Surgical History Past Surgical History  Procedure Laterality Date  . Cholecystectomy  05/2012  . Gastric bypass  11/2012    sleeve  . Cesarean section  1997    Gynecologic History G2P0010  No LMP recorded. Patient is not currently having periods (Reason: IUD). Contraception: abstinence and IUD Last Pap: 2015. Results were: normal Last mammogram: 2016. Results were: abnormal on left with f/u scans normal   Obstetric History OB History  Gravida Para Term Preterm AB SAB TAB Ectopic Multiple Living  2 1   1          # Outcome Date GA Lbr Len/2nd Weight Sex Delivery Anes PTL Lv  2 AB           1 Para               Current Medications Current Outpatient Prescriptions on File Prior to Visit  Medication Sig Dispense Refill  . levonorgestrel (MIRENA) 20 MCG/24HR IUD by Intrauterine route.    b complex vitamins capsule Take 1 capsule by mouth daily. Reported on 08/19/2015    .  calcium & magnesium carbonates (MYLANTA) 311-232 MG per tablet Take 1 tablet by mouth daily. Reported on 08/19/2015    . celecoxib (CELEBREX) 100 MG capsule Take 1 capsule (100 mg total) by mouth 2 (two) times daily. (Patient not taking: Reported on 08/19/2015) 60 capsule 2  . HYDROcodone-acetaminophen (NORCO/VICODIN) 5-325 MG per tablet Take 1 tablet by mouth 2 (two) times daily as needed for severe pain. (Patient not taking: Reported on 08/19/2015) 60 tablet 0  . Iron-Vit C-Vit B12-Folic Acid (IRON 100 PLUS PO) Take by mouth. Reported on 08/19/2015    . Lorcaserin HCl (BELVIQ) 10 MG TABS Take 1 tablet by mouth 2 (two) times daily. (Patient not taking: Reported on 08/19/2015) 60 tablet 2  . Multiple Vitamins-Minerals (MULTIVITAMIN ADULT PO) Take by mouth. Reported on 08/19/2015    . predniSONE (DELTASONE) 10 MG tablet 12 day tapering dose (Patient not taking: Reported on 08/19/2015) 48 tablet 0  . Vitamin D, Ergocalciferol, (DRISDOL) 50000 UNITS CAPS capsule Take by mouth. Reported on 08/19/2015     No current facility-administered medications on file prior to visit.    Review of Systems Patient denies any headaches, blurred vision, shortness of breath, chest pain, abdominal pain, problems with bowel movements, urination, or intercourse.  Objective:  BP 141/77 mmHg  Pulse 73  Ht 5\' 3"  (1.6 m)  Wt  353 lb 6.4 oz (160.301 kg)  BMI 62.62 kg/m2 Physical Exam  General:  Well developed, well nourished, no acute distress. She is alert and oriented x3. Skin:  Warm and dry Neck:  Midline trachea, no thyromegaly or nodules Cardiovascular: Regular rate and rhythm, no murmur heard Lungs:  Effort normal, all lung fields clear to auscultation bilaterally Breasts:  No dominant palpable mass, retraction, or nipple discharge Abdomen:  Soft, non tender, no hepatosplenomegaly or masses Pelvic:  External genitalia is normal in appearance.  The vagina is normal in appearance. The cervix is bulbous, no CMT.IUD string  barely visable due to body habitus.  Thin prep pap is not done . Uterus is felt to be normal size, shape, and contour.  No adnexal masses or tenderness noted. Extremities:  No swelling  noted Psych:  She has a normal mood and affect  Assessment:   Healthy well-woman exam Morbid obesity IUD check  Plan:  Continued IUD use F/U 1 year for AE, or sooner if needed Mammogram scheduled  Melody Suzan Nailer, CNM

## 2015-08-21 ENCOUNTER — Encounter: Payer: Self-pay | Admitting: Obstetrics and Gynecology

## 2015-10-02 IMAGING — MG MM DIGITAL SCREENING BILAT W/ CAD
5 series · 5 of 5 positions shown · non-contrast
Comparison: Previous exam(s).

CLINICAL DATA: Screening.

EXAM:
DIGITAL SCREENING BILATERAL MAMMOGRAM WITH CAD

[L CC]
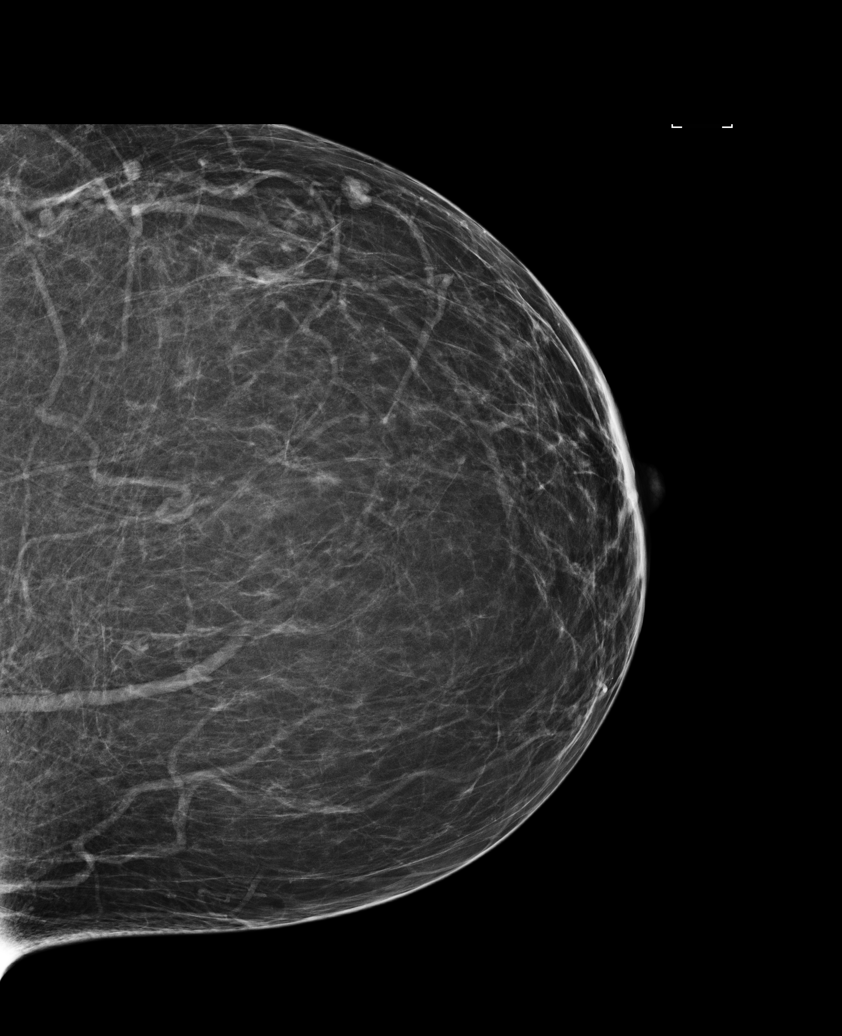

[L MLO]
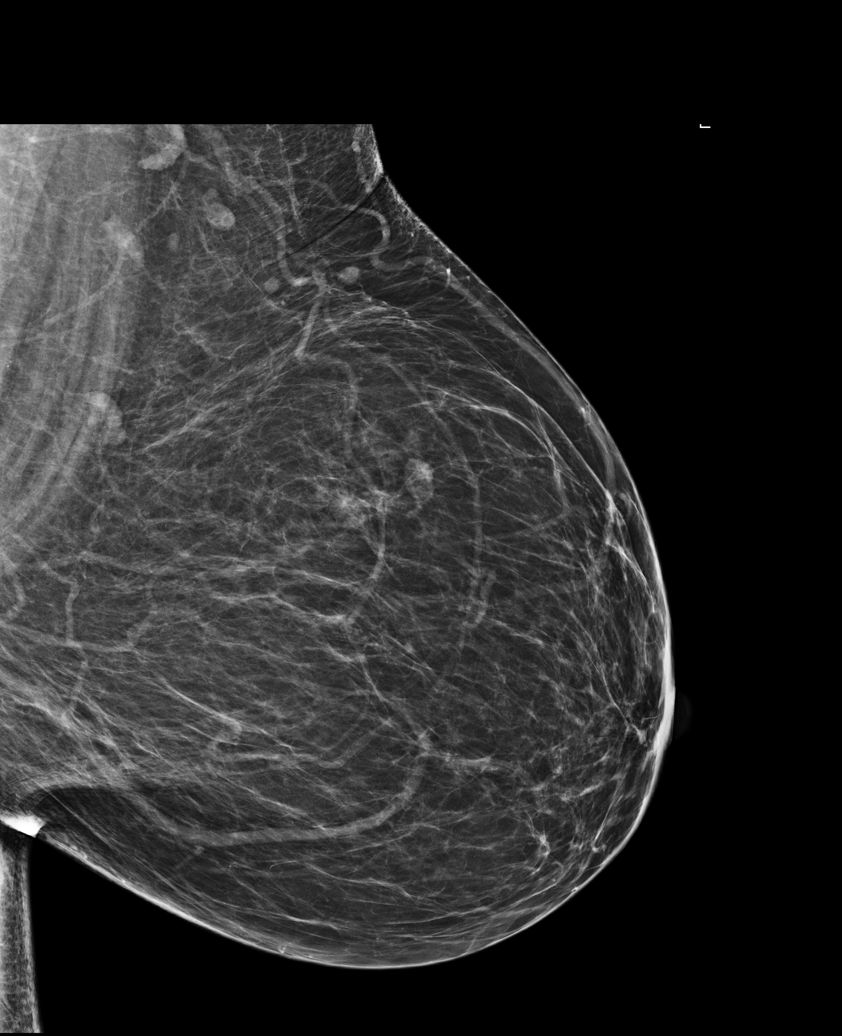

[R CC]
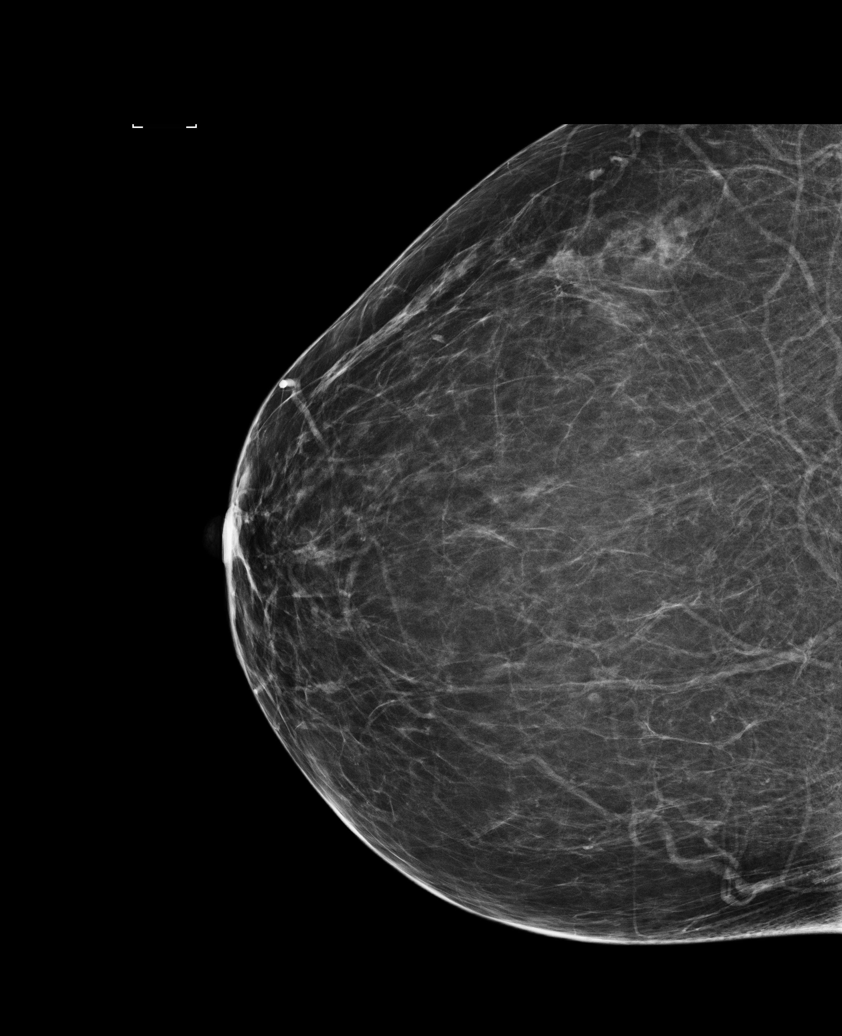

[R MLO (1 of 2)]
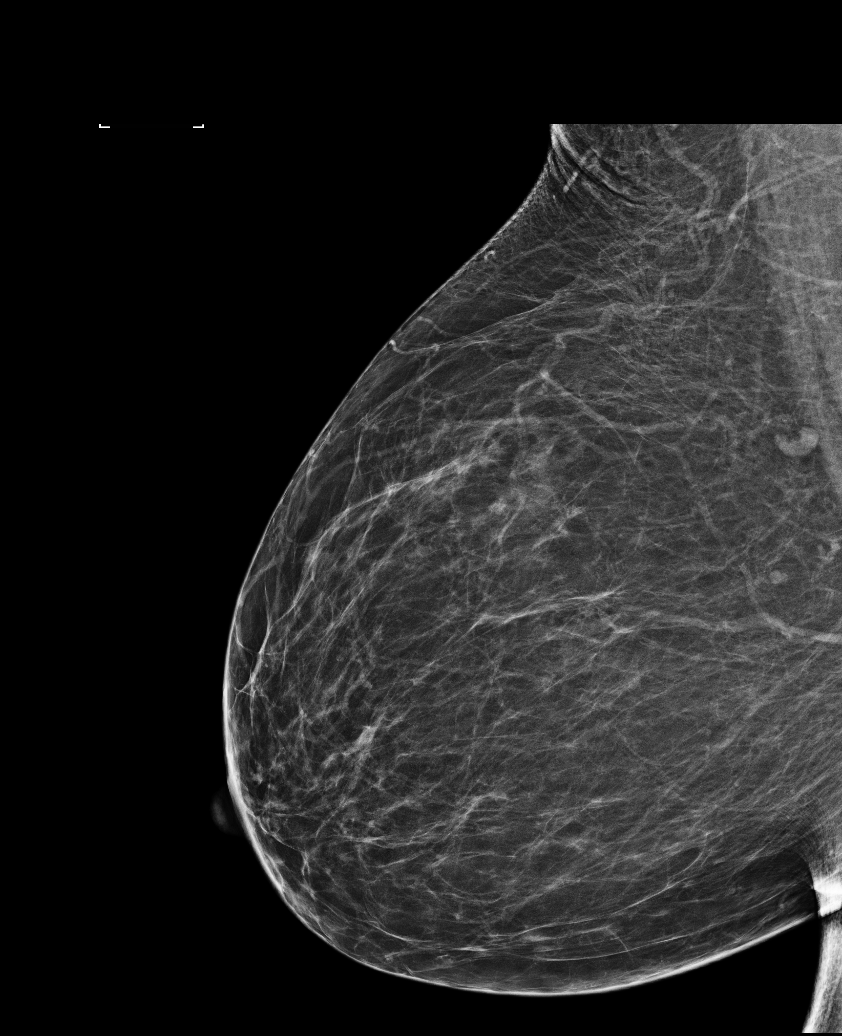

[R MLO (2 of 2)]
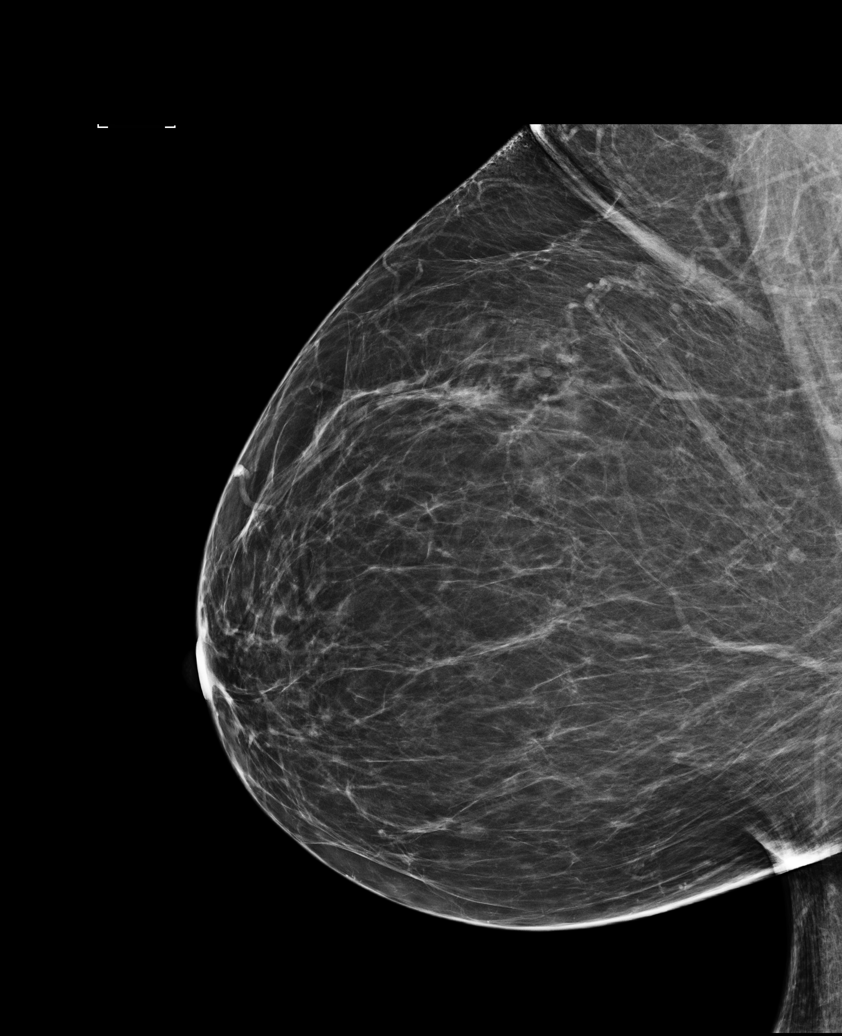

[5 of 5 positions shown; findings below may reference images not displayed]

ACR Breast Density Category c: The breast tissue is heterogeneously
dense, which may obscure small masses.
FINDINGS: In the left breast, possible distortion warrants further evaluation.
In the right breast, no findings suspicious for malignancy. Images
were processed with CAD.
IMPRESSION: Further evaluation is suggested for possible distortion in the left
breast.

RECOMMENDATION:
Diagnostic mammogram and possibly ultrasound of the left breast.
(Code:[id number])

The patient will be contacted regarding the findings, and additional
imaging will be scheduled.

BI-RADS CATEGORY  0: Incomplete. Need additional imaging evaluation
and/or prior mammograms for comparison.

## 2016-01-20 ENCOUNTER — Ambulatory Visit (INDEPENDENT_AMBULATORY_CARE_PROVIDER_SITE_OTHER): Payer: BLUE CROSS/BLUE SHIELD | Admitting: Podiatry

## 2016-01-20 ENCOUNTER — Encounter: Payer: Self-pay | Admitting: Podiatry

## 2016-01-20 VITALS — HR 82 | Resp 16

## 2016-01-20 DIAGNOSIS — Q828 Other specified congenital malformations of skin: Secondary | ICD-10-CM

## 2016-01-20 DIAGNOSIS — M216X9 Other acquired deformities of unspecified foot: Secondary | ICD-10-CM

## 2016-01-20 NOTE — Progress Notes (Signed)
Subjective:     Patient ID: Claudia Hoffman, female   DOB: 06-26-1965, 50 y.o.   MRN: [id number]  HPI patient presents stating that the left foot has a lesion on the bottom that's been sore   Review of Systems     Objective:   Physical Exam Neurovascular status intact with keratotic lesion sub-third metatarsal left with plantar flexion of the metatarsal noted and pain when palpated    Assessment:     Plantarflexed third metatarsal left with pain    Plan:     Discussed eventually may require elevating osteotomy and today debrided lesion applied medication with padding and reappoint as needed

## 2016-03-04 DIAGNOSIS — M159 Polyosteoarthritis, unspecified: Secondary | ICD-10-CM | POA: Insufficient documentation

## 2016-03-11 ENCOUNTER — Encounter: Payer: Self-pay | Admitting: Internal Medicine

## 2016-03-19 ENCOUNTER — Other Ambulatory Visit: Payer: Self-pay | Admitting: Internal Medicine

## 2016-03-19 DIAGNOSIS — R0602 Shortness of breath: Secondary | ICD-10-CM

## 2016-03-20 ENCOUNTER — Ambulatory Visit: Payer: BLUE CROSS/BLUE SHIELD

## 2016-03-20 ENCOUNTER — Ambulatory Visit
Admission: RE | Admit: 2016-03-20 | Discharge: 2016-03-20 | Disposition: A | Discharge status: Home / Self Care | Payer: BLUE CROSS/BLUE SHIELD | Source: Ambulatory Visit | Attending: Internal Medicine | Admitting: Internal Medicine

## 2016-03-20 DIAGNOSIS — J9811 Atelectasis: Secondary | ICD-10-CM | POA: Insufficient documentation

## 2016-03-20 DIAGNOSIS — I517 Cardiomegaly: Secondary | ICD-10-CM | POA: Diagnosis not present

## 2016-03-20 DIAGNOSIS — J984 Other disorders of lung: Secondary | ICD-10-CM | POA: Insufficient documentation

## 2016-03-20 DIAGNOSIS — R0602 Shortness of breath: Secondary | ICD-10-CM | POA: Diagnosis not present

## 2016-03-20 DIAGNOSIS — I2510 Atherosclerotic heart disease of native coronary artery without angina pectoris: Secondary | ICD-10-CM | POA: Insufficient documentation

## 2016-03-20 HISTORY — DX: Essential (primary) hypertension: I10

## 2016-03-20 MED ORDER — IOPAMIDOL (ISOVUE-370) INJECTION 76%
75.0000 mL | Freq: Once | INTRAVENOUS | Status: AC | PRN
  Administered 2016-03-20: 75 mL via INTRAVENOUS

## 2016-03-24 ENCOUNTER — Other Ambulatory Visit: Payer: BLUE CROSS/BLUE SHIELD

## 2016-04-13 ENCOUNTER — Ambulatory Visit
Admission: RE | Admit: 2016-04-13 | Discharge: 2016-04-13 | Disposition: A | Discharge status: Home / Self Care | Payer: BLUE CROSS/BLUE SHIELD | Source: Ambulatory Visit | Attending: Obstetrics and Gynecology | Admitting: Obstetrics and Gynecology

## 2016-04-13 DIAGNOSIS — Z1231 Encounter for screening mammogram for malignant neoplasm of breast: Secondary | ICD-10-CM | POA: Insufficient documentation

## 2016-04-13 DIAGNOSIS — Z01419 Encounter for gynecological examination (general) (routine) without abnormal findings: Secondary | ICD-10-CM

## 2016-04-22 ENCOUNTER — Encounter: Payer: BLUE CROSS/BLUE SHIELD | Admitting: Obstetrics and Gynecology

## 2016-05-06 ENCOUNTER — Encounter: Payer: Self-pay | Admitting: Obstetrics and Gynecology

## 2016-05-06 ENCOUNTER — Ambulatory Visit (INDEPENDENT_AMBULATORY_CARE_PROVIDER_SITE_OTHER): Payer: BLUE CROSS/BLUE SHIELD | Admitting: Obstetrics and Gynecology

## 2016-05-06 VITALS — BP 117/68 | HR 94 | Wt 358.9 lb

## 2016-05-06 DIAGNOSIS — Z30432 Encounter for removal of intrauterine contraceptive device: Secondary | ICD-10-CM

## 2016-05-06 DIAGNOSIS — Z6841 Body Mass Index (BMI) 40.0 and over, adult: Secondary | ICD-10-CM

## 2016-05-06 NOTE — Progress Notes (Signed)
      GYNECOLOGY OFFICE PROCEDURE NOTE  Claudia Hoffman is a 51 y.o. G2P0010 here for Mirena IUD removal. No GYN concerns.  Last pap smear was on 07/2014 and was normal.  IUD Removal  Patient identified, informed consent performed, consent signed.  Patient was in the dorsal lithotomy position, normal external genitalia was noted.  A speculum was placed in the patient's vagina, normal discharge was noted, no lesions. The cervix was visualized, no lesions, no abnormal discharge.  The strings of the IUD were grasped and pulled using ring forceps. The IUD was removed in its entirety.  Patient tolerated the procedure well.    Patient is now menopausal and does not require any further contraception.  Routine preventative health maintenance measures emphasized.  Hildred Laser, MD [facility]

## 2016-05-20 ENCOUNTER — Encounter: Payer: BLUE CROSS/BLUE SHIELD | Admitting: Obstetrics and Gynecology

## 2016-06-15 ENCOUNTER — Encounter: Payer: Self-pay | Admitting: Podiatry

## 2016-06-15 ENCOUNTER — Ambulatory Visit (INDEPENDENT_AMBULATORY_CARE_PROVIDER_SITE_OTHER): Payer: BLUE CROSS/BLUE SHIELD | Admitting: Podiatry

## 2016-06-15 ENCOUNTER — Ambulatory Visit (INDEPENDENT_AMBULATORY_CARE_PROVIDER_SITE_OTHER): Payer: BLUE CROSS/BLUE SHIELD

## 2016-06-15 DIAGNOSIS — M79671 Pain in right foot: Secondary | ICD-10-CM

## 2016-06-15 DIAGNOSIS — M79672 Pain in left foot: Secondary | ICD-10-CM

## 2016-06-15 DIAGNOSIS — M722 Plantar fascial fibromatosis: Secondary | ICD-10-CM | POA: Diagnosis not present

## 2016-06-15 MED ORDER — GABAPENTIN 300 MG PO CAPS: 300.0000 mg | ORAL_CAPSULE | Freq: Three times a day (TID) | ORAL | 3 refills | Status: DC

## 2016-06-15 MED ORDER — TRIAMCINOLONE ACETONIDE 10 MG/ML IJ SUSP
10.0000 mg | Freq: Once | INTRAMUSCULAR | Status: AC
  Administered 2016-06-15: 10 mg

## 2016-06-17 NOTE — Progress Notes (Signed)
Subjective:     Patient ID: Claudia Hoffman, female   DOB: [birth year], 51 y.o.   MRN: [id number]  HPI patient presents with continuation of chronic plantar fascial symptomatology bilateral with obesity is complicating factor and has been diagnosed with rheumatoid arthritis   Review of Systems     Objective:   Physical Exam Neurovascular status intact with patient found to have significant discomfort distal to the fascial insertion heel both feet with moderate depression of the arch and pain worse when she gets up in the morning    Assessment:     Chronic plantar fasciitis left and right foot distal to the insertion calcaneus    Plan:     Condition reviewed and relationship to obesity. Today I went ahead and I dispensed a night splint with instructions on heat ice therapy and I injected the plantar fascia bilateral 3 mg Kenalog 5 mg Xylocaine and discussed support treatment

## 2016-06-25 ENCOUNTER — Other Ambulatory Visit: Payer: Self-pay | Admitting: *Deleted

## 2016-06-25 ENCOUNTER — Telehealth: Payer: Self-pay | Admitting: Obstetrics and Gynecology

## 2016-06-25 MED ORDER — HYDROCORTISONE ACE-PRAMOXINE 2.5-1 % RE CREA: 1.0000 "application " | TOPICAL_CREAM | Freq: Three times a day (TID) | RECTAL | 2 refills | Status: DC

## 2016-06-25 NOTE — Telephone Encounter (Signed)
Patient needs to know if she can get anything stronger than the sample cream for hemorrhoids called in  Pharmacy [facility]  Call patient [phone]

## 2016-06-25 NOTE — Telephone Encounter (Signed)
Done-ac 

## 2016-06-28 HISTORY — PX: BAND HEMORRHOIDECTOMY: SHX1213

## 2016-07-06 ENCOUNTER — Encounter: Payer: Self-pay | Admitting: Podiatry

## 2016-07-06 ENCOUNTER — Ambulatory Visit (INDEPENDENT_AMBULATORY_CARE_PROVIDER_SITE_OTHER): Payer: BLUE CROSS/BLUE SHIELD | Admitting: Podiatry

## 2016-07-06 DIAGNOSIS — M722 Plantar fascial fibromatosis: Secondary | ICD-10-CM | POA: Diagnosis not present

## 2016-07-06 NOTE — Progress Notes (Signed)
Subjective:     Patient ID: Claudia Hoffman, female   DOB: 1965-07-13, 51 y.o.   MRN: [id number]  HPI patient continues to experience discomfort in the plantar heels bilateral with obesity is complicating factor   Review of Systems     Objective:   Physical Exam Neurovascular status intact with patient found to have flatfoot deformity with discomfort in the plantar heel and into the plantar arch left over right    Assessment:     Continue plantar fasciitis secondary to foot structure with obesity is complicating factor    Plan:     At this time went ahead and recommended shockwave therapy and reviewed this with her and she will have her foot done on her left first and then the right one

## 2016-07-13 ENCOUNTER — Other Ambulatory Visit: Payer: BLUE CROSS/BLUE SHIELD

## 2016-07-20 ENCOUNTER — Other Ambulatory Visit: Payer: BLUE CROSS/BLUE SHIELD

## 2016-07-27 ENCOUNTER — Other Ambulatory Visit: Payer: BLUE CROSS/BLUE SHIELD

## 2016-08-19 ENCOUNTER — Encounter: Payer: Self-pay | Admitting: Obstetrics and Gynecology

## 2016-08-19 ENCOUNTER — Ambulatory Visit (INDEPENDENT_AMBULATORY_CARE_PROVIDER_SITE_OTHER): Payer: BLUE CROSS/BLUE SHIELD | Admitting: Obstetrics and Gynecology

## 2016-08-19 ENCOUNTER — Other Ambulatory Visit: Payer: Self-pay | Admitting: Obstetrics and Gynecology

## 2016-08-19 VITALS — BP 126/75 | HR 88 | Ht 61.0 in | Wt 364.0 lb

## 2016-08-19 DIAGNOSIS — Z01411 Encounter for gynecological examination (general) (routine) with abnormal findings: Secondary | ICD-10-CM

## 2016-08-19 DIAGNOSIS — E559 Vitamin D deficiency, unspecified: Secondary | ICD-10-CM

## 2016-08-19 MED ORDER — FLUCONAZOLE 150 MG PO TABS: 150.0000 mg | ORAL_TABLET | Freq: Once | ORAL | 3 refills | Status: AC

## 2016-08-19 MED ORDER — TERCONAZOLE 0.4 % VA CREA: 1.0000 | TOPICAL_CREAM | Freq: Every day | VAGINAL | 0 refills | Status: DC

## 2016-08-19 NOTE — Progress Notes (Signed)
Subjective:   Claudia Hoffman is a 51 y.o. G48P0010 Caucasian female here for a routine well-woman exam.  No LMP recorded (lmp unknown). Patient is postmenopausal.    Current complaints: rectal pain due to hemorrhoidectomy PCP: me       does desire labs  Social History: Sexual: heterosexual Marital Status: married Living situation: alone Occupation: hairdresser Tobacco/alcohol: no tobacco use Illicit drugs: no history of illicit drug use  The following portions of the patient's history were reviewed and updated as appropriate: allergies, current medications, past family history, past medical history, past social history, past surgical history and problem list.  Past Medical History Past Medical History:  Diagnosis Date  . Allergy   . Anemia   . Arthritis   . Asthma   . Atopic dermatitis   . GERD (gastroesophageal reflux disease)   . Hypertension   . Iron deficiency   . Obesity, Class III, BMI 40-49.9 (morbid obesity) (HCC) 11/2012   s/p bariatric sleeve surgery by Dr. Smitty Cords   . Sleep apnea     Past Surgical History Past Surgical History:  Procedure Laterality Date  . CESAREAN SECTION  1997  . CHOLECYSTECTOMY  05/2012  . GASTRIC BYPASS  11/2012   sleeve    Gynecologic History G2P0010  No LMP recorded (lmp unknown). Patient is postmenopausal. Contraception: post menopausal status Last Pap: 2016. Results were: normal Last mammogram: 2018. Results were: normal   Obstetric History OB History  Gravida Para Term Preterm AB Living  2 1     1     SAB TAB Ectopic Multiple Live Births               # Outcome Date GA Lbr Len/2nd Weight Sex Delivery Anes PTL Lv  2 AB           1 Para               Current Medications Current Outpatient Prescriptions on File Prior to Visit  Medication Sig Dispense Refill  . b complex vitamins capsule Take 1 capsule by mouth daily. Reported on 08/19/2015    . calcium & magnesium carbonates (MYLANTA) 311-232 MG per tablet Take 1 tablet by  mouth daily. Reported on 08/19/2015    . hydrocortisone-pramoxine (ANALPRAM HC) 2.5-1 % rectal cream Place 1 application rectally 3 (three) times daily. 30 g 2  . Multiple Vitamins-Minerals (MULTIVITAMIN ADULT PO) Take by mouth. Reported on 08/19/2015    . gabapentin (NEURONTIN) 300 MG capsule Take 1 capsule (300 mg total) by mouth 3 (three) times daily. 90 capsule 3  . Iron-Vit C-Vit B12-Folic Acid (IRON 100 PLUS PO) Take by mouth. Reported on 08/19/2015    . Vitamin D, Ergocalciferol, (DRISDOL) 50000 UNITS CAPS capsule Take by mouth. Reported on 08/19/2015     No current facility-administered medications on file prior to visit.     Review of Systems Patient denies any headaches, blurred vision, shortness of breath, chest pain, abdominal pain, problems with bowel movements, urination, or intercourse.  Objective:  BP 126/75   Pulse 88   Ht 5\' 1"  (1.549 m)   Wt (!) 364 lb (165.1 kg)   LMP  (LMP Unknown)   BMI 68.78 kg/m  Physical Exam  General:  Well developed, well nourished, no acute distress. She is alert and oriented x3. Skin:  Warm and dry Neck:  Midline trachea, no thyromegaly or nodules Cardiovascular: Regular rate and rhythm, no murmur heard Lungs:  Effort normal, all lung fields clear to auscultation bilaterally  Breasts:  No dominant palpable mass, retraction, or nipple discharge, yeast rash under both breast. Abdomen:  Soft, non tender, no hepatosplenomegaly or masses, yeast rash under abdominal fold Pelvic:  External genitalia is normal in appearance.  The vagina is normal in appearance. The cervix is bulbous, no CMT.  Thin prep pap is done with HR HPV cotesting. Uterus is felt to be normal size, shape, and contour.  No adnexal masses or tenderness noted. Extremities:  No swelling or varicosities noted Psych:  She has a normal mood and affect  Assessment:   Healthy well-woman exam Morbid obesity Tinea under breast and in groin  Plan:  Labs obtained F/U 1 year for AE, or  sooner if needed ``  Melody Suzan Nailer, CNM

## 2016-08-20 LAB — FERRITIN: FERRITIN: 105 ng/mL (ref 15–150)

## 2016-08-20 LAB — VITAMIN D 25 HYDROXY (VIT D DEFICIENCY, FRACTURES): Vit D, 25-Hydroxy: 54.1 ng/mL (ref 30.0–100.0)

## 2016-08-21 LAB — CYTOLOGY - PAP

## 2016-08-26 ENCOUNTER — Telehealth: Payer: Self-pay | Admitting: *Deleted

## 2016-08-26 NOTE — Telephone Encounter (Signed)
Mailed info to pt 

## 2016-08-26 NOTE — Telephone Encounter (Signed)
-----   Message from Purcell Nails, [place] sent at 08/25/2016  3:21 PM EDT ----- Please let her know pap and HPV were both negative

## 2016-09-23 ENCOUNTER — Encounter
Admission: RE | Admit: 2016-09-23 | Discharge: 2016-09-23 | Disposition: A | Discharge status: Home / Self Care | Payer: BLUE CROSS/BLUE SHIELD | Source: Ambulatory Visit | Attending: Surgery | Admitting: Surgery

## 2016-09-23 DIAGNOSIS — Z6841 Body Mass Index (BMI) 40.0 and over, adult: Secondary | ICD-10-CM | POA: Diagnosis not present

## 2016-09-23 DIAGNOSIS — K219 Gastro-esophageal reflux disease without esophagitis: Secondary | ICD-10-CM | POA: Diagnosis not present

## 2016-09-23 DIAGNOSIS — I10 Essential (primary) hypertension: Secondary | ICD-10-CM | POA: Diagnosis not present

## 2016-09-23 DIAGNOSIS — K601 Chronic anal fissure: Secondary | ICD-10-CM | POA: Diagnosis not present

## 2016-09-23 DIAGNOSIS — J45909 Unspecified asthma, uncomplicated: Secondary | ICD-10-CM | POA: Diagnosis not present

## 2016-09-23 DIAGNOSIS — G4730 Sleep apnea, unspecified: Secondary | ICD-10-CM | POA: Diagnosis not present

## 2016-09-23 HISTORY — DX: Headache, unspecified: R51.9

## 2016-09-23 HISTORY — DX: Headache: R51

## 2016-09-23 HISTORY — DX: Family history of other specified conditions: Z84.89

## 2016-09-23 NOTE — Patient Instructions (Signed)
  Your procedure is scheduled on: 09-25-16 FRIDAY Report to Same Day Surgery 2nd floor medical mall [facility] Entrance-take elevator on left to 2nd floor.  Check in with surgery information desk.) To find out your arrival time please call [phone] between 1PM - 3PM on 09-24-16 THURSDAY  Remember: Instructions that are not followed completely may result in serious medical risk, up to and including death, or upon the discretion of your surgeon and anesthesiologist your surgery may need to be rescheduled.    _x___ 1. Do not eat food or drink liquids after midnight. No gum chewing or  hard candies.     __x__ 2. No Alcohol for 24 hours before or after surgery.   __x__3. No Smoking for 24 prior to surgery.   ____  4. Bring all medications with you on the day of surgery if instructed.    __x__ 5. Notify your doctor if there is any change in your medical condition     (cold, fever, infections).     Do not wear jewelry, make-up, hairpins, clips or nail polish.  Do not wear lotions, powders, or perfumes. You may wear deodorant.  Do not shave 48 hours prior to surgery. Men may shave face and neck.  Do not bring valuables to the hospital.    [facility] is not responsible for any belongings or valuables.               Contacts, dentures or bridgework may not be worn into surgery.  Leave your suitcase in the car. After surgery it may be brought to your room.  For patients admitted to the hospital, discharge time is determined by your treatment team.   Patients discharged the day of surgery will not be allowed to drive home.  You will need someone to drive you home and stay with you the night of your procedure.    Please read over the following fact sheets that you were given:     _x___ TAKE THE FOLLOWING MEDICATIONS THE MORNING OF SURGERY WITH A SMALL SIP OF WATER. These include:  1. GABAPENTIN  2. AMITRIPTYLINE  3.  4.  5.  6.  ____Fleets enema or Magnesium Citrate as  directed.   ____ Use CHG Soap or sage wipes as directed on instruction sheet   _X___ Use inhalers on the day of surgery and bring to hospital day of surgery-USE ALBUTEROL INHALER AT HOME AND BRING TO HOSPITAL  ____ Stop Metformin and Janumet 2 days prior to surgery.    ____ Take 1/2 of usual insulin dose the night before surgery and none on the morning surgery.   ____ Follow recommendations from Cardiologist, Pulmonologist or PCP regarding stopping Aspirin, Coumadin, Pllavix ,Eliquis, Effient, or Pradaxa, and Pletal.  X____Stop Anti-inflammatories such as Advil, Aleve, IBUPROFEN, Motrin, Naproxen, Naprosyn, Goodies powders or aspirin products NOW-OK to take Tylenol    _x___ Stop supplements until after surgery-STOP BLACK COHOSH NOW-MAY RESUME AFTER SURGERY   ____ Bring C-Pap to the hospital.

## 2016-09-23 NOTE — Pre-Procedure Instructions (Signed)
Echo complete12/20/2017 [facility] Component Name Value Ref Range  LV Ejection Fraction (%) 55   Aortic Valve Stenosis Grade none   Aortic Valve Regurgitation Grade trivial   Mitral Valve Stenosis Grade none   Mitral Valve Regurgitation Grade trivial   Tricuspid Valve Regurgitation Grade mild   Tricuspid Valve Regurgitation Max Velocity (m/s) 3.7 m/sec   Right Ventricle Systolic Pressure (mmHg) 58.3 mmHg   LV End Diastolic Diameter (cm) 5.0 cm  LV End Systolic Diameter (cm) 2.8 cm  LV Septum Wall Thickness (cm) 0.99 cm  LV Posterior Wall Thickness (cm) 0.91 cm  Left Atrium Diameter (cm) 3.9 cm  Result Narrative  INTERNAL MEDICINE DEPARTMENTGLASS, Mckenzy  [name] [id number] A [facility] PRACTICEAcct #: [id number] [address] [place] [address] Date: 03/18/2016 03:50 PM Adult Female Age: 51 yrs ECHOCARDIOGRAM REPORT Outpatient  STUDY:CHEST WALL TAPE:0000:00: 0:00:00 KC::KCWI ECHO:Yes DOPPLER:YesFILE:0000-000-000 MD1:SPARKS, JEFFREY D  COLOR:YesCONTRAST:No MACHINE:Philips Height: 62 in  RV BIOPSY:No 3D:NoSOUND QLTY:ModerateWeight: 359 lb MEDIUM:None BSA: 2.5 m2  ___________________________________________________________________________________________  HISTORY:DOE REASON:Assess, LV function INDICATION:DOE (dyspnea on exertion) [R06.09 (ICD-10-CM)]  ___________________________________________________________________________________________ ECHOCARDIOGRAPHIC MEASUREMENTS 2D DIMENSIONS AORTA ValuesNormal RangeMAIN  PAValuesNormal Range Annulus:nm* [2.1 - 2.5]PA Main:nm* [1.5 - 2.1] Aorta Sin:nm* [2.7 - 3.3] RIGHT VENTRICLE ST Junction:nm* [2.3 - 2.9]RV Base:nm* [ < 4.2] Asc.Aorta:nm* [2.3 - 3.1] RV Mid:nm* [ < 3.5]  LEFT VENTRICLERV Length:nm* [ < 8.6] LVIDd:5.0 cm[3.9 - 5.3] INFERIOR VENA CAVA LVIDs:2.8 cmMax. IVC:nm* [ <= 2.1]  FS:44.4 %[> 25]Min. IVC:nm* SWT:0.99 cm [0.5 - 0.9] ------------------ PWT:0.91 cm [0.5 - 0.9] nm* - not measured  LEFT ATRIUM LA Diam:3.9 cm[2.7 - 3.8] LA A4C Area:nm* [ < 20] LA Volume:nm* [22 - 52] ___________________________________________________________________________________________  ECHOCARDIOGRAPHIC DESCRIPTIONS  AORTIC ROOT Size:Normal Dissection:INDETERM FOR DISSECTION  AORTIC VALVE Leaflets:Tricuspid Morphology:Normal Mobility:Fully mobile  LEFT VENTRICLE Size:NormalAnterior:Normal  Contraction:Normal Lateral:Normal Closest EF:>55% (Estimated)Septal:Normal  LV Masses:No Masses Apical:Normal  RDE:YCXKGYJEHUDJ:SHFWYO Posterior:Normal Dias.FxClass:(Grade 2) relaxation abnormal, pseudonormal  MITRAL VALVE Leaflets:NormalMobility:Fully mobile Morphology:Normal  LEFT ATRIUM Size:Normal LA Masses:No masses  IA Septum:Normal IAS  MAIN PA Size:Normal  PULMONIC  VALVE Morphology:NormalMobility:Fully mobile  RIGHT VENTRICLE  RV Masses:No Masses Size:Normal  Free Wall:Normal Contraction:Normal  TRICUSPID VALVE Leaflets:NormalMobility:Fully mobile Morphology:Normal  RIGHT ATRIUM Size:NormalRA Other:None  RA Mass:No masses  PERICARDIUM  Fluid:No effusion  INFERIOR VENACAVA Size:Normal Normal respiratory collapse  _______________________________________________________________________  DOPPLER ECHO and OTHER SPECIAL PROCEDURES  Aortic:TRIVIAL ARNo AS   Mitral:TRIVIAL MRNo MS MV Inflow E Vel=137.0 cm/secMV Annulus E'Vel=12.9 cm/sec E/E'Ratio=10.6  Tricuspid:MILD TR No TS 371.9 cm/sec peak TR vel58.3 mmHg peak RV pressure  Pulmonary:TRIVIAL PRNo PS    ___________________________________________________________________________________________  INTERPRETATION NORMAL LEFT VENTRICULAR SYSTOLIC FUNCTION NORMAL RIGHT VENTRICULAR SYSTOLIC FUNCTION MILD VALVULAR REGURGITATION (See above) NO VALVULAR STENOSIS Mild/moderately elevated pulmonary pressures  ___________________________________________________________________________________________  Electronically signed by: Danella Penton, MD on 03/18/2016 06:13 PM Performed By: Rayetta Humphrey, RCS Ordering Physician: Judithann Sheen, JEFFREY ___________________________________________________________________________________________  Status Results Details

## 2016-09-25 ENCOUNTER — Ambulatory Visit: Payer: BLUE CROSS/BLUE SHIELD | Admitting: Anesthesiology

## 2016-09-25 ENCOUNTER — Other Ambulatory Visit: Payer: BLUE CROSS/BLUE SHIELD

## 2016-09-25 ENCOUNTER — Ambulatory Visit
Admission: RE | Admit: 2016-09-25 | Discharge: 2016-09-25 | Disposition: A | Discharge status: Home / Self Care | Payer: BLUE CROSS/BLUE SHIELD | Source: Ambulatory Visit | Attending: Surgery | Admitting: Surgery

## 2016-09-25 ENCOUNTER — Encounter: Payer: Self-pay | Admitting: *Deleted

## 2016-09-25 ENCOUNTER — Encounter
Admission: RE | Disposition: A | Discharge status: Home / Self Care | Payer: Self-pay | Source: Ambulatory Visit | Attending: Surgery

## 2016-09-25 DIAGNOSIS — G4730 Sleep apnea, unspecified: Secondary | ICD-10-CM | POA: Insufficient documentation

## 2016-09-25 DIAGNOSIS — K219 Gastro-esophageal reflux disease without esophagitis: Secondary | ICD-10-CM | POA: Insufficient documentation

## 2016-09-25 DIAGNOSIS — K601 Chronic anal fissure: Secondary | ICD-10-CM | POA: Insufficient documentation

## 2016-09-25 DIAGNOSIS — Z6841 Body Mass Index (BMI) 40.0 and over, adult: Secondary | ICD-10-CM | POA: Insufficient documentation

## 2016-09-25 DIAGNOSIS — J45909 Unspecified asthma, uncomplicated: Secondary | ICD-10-CM | POA: Insufficient documentation

## 2016-09-25 DIAGNOSIS — I10 Essential (primary) hypertension: Secondary | ICD-10-CM | POA: Insufficient documentation

## 2016-09-25 HISTORY — PX: SPHINCTEROTOMY: SHX5279

## 2016-09-25 SURGERY — SPHINCTEROTOMY, ANAL
Anesthesia: General | Wound class: Clean Contaminated

## 2016-09-25 MED ORDER — DEXAMETHASONE SODIUM PHOSPHATE 10 MG/ML IJ SOLN
INTRAMUSCULAR | Status: DC | PRN
  Administered 2016-09-25: 10 mg via INTRAVENOUS

## 2016-09-25 MED ORDER — DEXAMETHASONE SODIUM PHOSPHATE 10 MG/ML IJ SOLN
INTRAMUSCULAR | Status: AC
  Filled 2016-09-25: qty 1

## 2016-09-25 MED ORDER — ONDANSETRON HCL 4 MG/2ML IJ SOLN
INTRAMUSCULAR | Status: DC | PRN
  Administered 2016-09-25: 4 mg via INTRAVENOUS

## 2016-09-25 MED ORDER — FENTANYL CITRATE (PF) 100 MCG/2ML IJ SOLN
INTRAMUSCULAR | Status: AC
  Filled 2016-09-25: qty 2

## 2016-09-25 MED ORDER — ROCURONIUM BROMIDE 50 MG/5ML IV SOLN
INTRAVENOUS | Status: AC
  Filled 2016-09-25: qty 1

## 2016-09-25 MED ORDER — KETOROLAC TROMETHAMINE 30 MG/ML IJ SOLN: 30.0000 mg | Freq: Once | INTRAMUSCULAR | Status: DC

## 2016-09-25 MED ORDER — FENTANYL CITRATE (PF) 100 MCG/2ML IJ SOLN
INTRAMUSCULAR | Status: DC | PRN
  Administered 2016-09-25: 25 ug via INTRAVENOUS
  Administered 2016-09-25 (×2): 50 ug via INTRAVENOUS
  Administered 2016-09-25: 25 ug via INTRAVENOUS

## 2016-09-25 MED ORDER — MIDAZOLAM HCL 2 MG/2ML IJ SOLN
INTRAMUSCULAR | Status: DC | PRN
  Administered 2016-09-25: 2 mg via INTRAVENOUS

## 2016-09-25 MED ORDER — BUPIVACAINE-EPINEPHRINE (PF) 0.5% -1:200000 IJ SOLN
INTRAMUSCULAR | Status: AC
  Filled 2016-09-25: qty 30

## 2016-09-25 MED ORDER — ONDANSETRON HCL 4 MG/2ML IJ SOLN
INTRAMUSCULAR | Status: AC
  Filled 2016-09-25: qty 2

## 2016-09-25 MED ORDER — ESMOLOL HCL 100 MG/10ML IV SOLN
INTRAVENOUS | Status: DC | PRN
  Administered 2016-09-25 (×2): 10 mg via INTRAVENOUS

## 2016-09-25 MED ORDER — ONDANSETRON HCL 4 MG/2ML IJ SOLN
4.0000 mg | Freq: Once | INTRAMUSCULAR | Status: AC | PRN
  Administered 2016-09-25: 4 mg via INTRAVENOUS

## 2016-09-25 MED ORDER — BUPIVACAINE-EPINEPHRINE (PF) 0.5% -1:200000 IJ SOLN
INTRAMUSCULAR | Status: DC | PRN
  Administered 2016-09-25: 7 mL

## 2016-09-25 MED ORDER — FENTANYL CITRATE (PF) 100 MCG/2ML IJ SOLN
25.0000 ug | INTRAMUSCULAR | Status: DC | PRN
  Administered 2016-09-25 (×4): 25 ug via INTRAVENOUS

## 2016-09-25 MED ORDER — GLYCOPYRROLATE 0.2 MG/ML IJ SOLN
INTRAMUSCULAR | Status: DC | PRN
  Administered 2016-09-25: 0.2 mg via INTRAVENOUS

## 2016-09-25 MED ORDER — SUCCINYLCHOLINE CHLORIDE 20 MG/ML IJ SOLN
INTRAMUSCULAR | Status: DC | PRN
  Administered 2016-09-25: 140 mg via INTRAVENOUS

## 2016-09-25 MED ORDER — MIDAZOLAM HCL 2 MG/2ML IJ SOLN
INTRAMUSCULAR | Status: AC
  Filled 2016-09-25: qty 2

## 2016-09-25 MED ORDER — GLYCOPYRROLATE 0.2 MG/ML IJ SOLN
INTRAMUSCULAR | Status: AC
  Filled 2016-09-25: qty 1

## 2016-09-25 MED ORDER — HYDROCODONE-ACETAMINOPHEN 5-325 MG PO TABS: 1.0000 | ORAL_TABLET | ORAL | 0 refills | Status: DC | PRN

## 2016-09-25 MED ORDER — KETOROLAC TROMETHAMINE 30 MG/ML IJ SOLN
INTRAMUSCULAR | Status: AC
  Administered 2016-09-25: 30 mg
  Filled 2016-09-25: qty 1

## 2016-09-25 MED ORDER — PROPOFOL 10 MG/ML IV BOLUS
INTRAVENOUS | Status: AC
  Filled 2016-09-25: qty 20

## 2016-09-25 MED ORDER — PROPOFOL 10 MG/ML IV BOLUS
INTRAVENOUS | Status: DC | PRN
  Administered 2016-09-25: 50 mg via INTRAVENOUS
  Administered 2016-09-25: 200 mg via INTRAVENOUS
  Administered 2016-09-25: 50 mg via INTRAVENOUS

## 2016-09-25 MED ORDER — LACTATED RINGERS IV SOLN
INTRAVENOUS | Status: DC
  Administered 2016-09-25 (×2): via INTRAVENOUS

## 2016-09-25 MED ORDER — HYDROCODONE-ACETAMINOPHEN 5-325 MG PO TABS
1.0000 | ORAL_TABLET | ORAL | Status: DC | PRN
  Administered 2016-09-25 (×2): 1 via ORAL

## 2016-09-25 MED ORDER — ESMOLOL HCL 100 MG/10ML IV SOLN
INTRAVENOUS | Status: AC
  Filled 2016-09-25: qty 10

## 2016-09-25 MED ORDER — HYDROCODONE-ACETAMINOPHEN 5-325 MG PO TABS
ORAL_TABLET | ORAL | Status: AC
  Filled 2016-09-25: qty 2

## 2016-09-25 MED ORDER — LIDOCAINE HCL (CARDIAC) 20 MG/ML IV SOLN
INTRAVENOUS | Status: DC | PRN
  Administered 2016-09-25: 100 mg via INTRAVENOUS

## 2016-09-25 SURGICAL SUPPLY — 25 items
BLADE SURG 15 STRL LF DISP TIS (BLADE) ×1 IMPLANT
BLADE SURG 15 STRL SS (BLADE) ×2
CANISTER SUCT 1200ML W/VALVE (MISCELLANEOUS) ×3 IMPLANT
DRAPE LAPAROTOMY 100X77 ABD (DRAPES) ×3 IMPLANT
DRAPE LEGGINS SURG 28X43 STRL (DRAPES) ×3 IMPLANT
ELECT REM PT RETURN 9FT ADLT (ELECTROSURGICAL) ×3
ELECTRODE REM PT RTRN 9FT ADLT (ELECTROSURGICAL) ×1 IMPLANT
GAUZE SPONGE 4X4 12PLY STRL (GAUZE/BANDAGES/DRESSINGS) ×3 IMPLANT
GLOVE BIO SURGEON STRL SZ7.5 (GLOVE) ×15 IMPLANT
GOWN STRL REUS W/ TWL LRG LVL3 (GOWN DISPOSABLE) ×3 IMPLANT
GOWN STRL REUS W/TWL LRG LVL3 (GOWN DISPOSABLE) ×6
KIT RM TURNOVER CYSTO AR (KITS) ×3 IMPLANT
LABEL OR SOLS (LABEL) ×3 IMPLANT
NEEDLE HYPO 25X1 1.5 SAFETY (NEEDLE) ×3 IMPLANT
NS IRRIG 500ML POUR BTL (IV SOLUTION) ×3 IMPLANT
PACK BASIN MINOR ARMC (MISCELLANEOUS) ×3 IMPLANT
PAD PREP 24X41 OB/GYN DISP (PERSONAL CARE ITEMS) ×3 IMPLANT
SOL PREP PVP 2OZ (MISCELLANEOUS) ×3
SOLUTION PREP PVP 2OZ (MISCELLANEOUS) ×1 IMPLANT
SURGILUBE 2OZ TUBE FLIPTOP (MISCELLANEOUS) ×3 IMPLANT
SUT CHROMIC 3 0 SH 27 (SUTURE) ×3 IMPLANT
SUT CHROMIC 5 0 RB 1 27 (SUTURE) ×3 IMPLANT
SUT VIC AB 3-0 SH 27 (SUTURE) ×2
SUT VIC AB 3-0 SH 27X BRD (SUTURE) ×1 IMPLANT
SYRINGE 10CC LL (SYRINGE) ×3 IMPLANT

## 2016-09-25 NOTE — Op Note (Signed)
OPERATIVE REPORT  PREOPERATIVE  DIAGNOSIS: . Chronic anal fissure  POSTOPERATIVE DIAGNOSIS: . Chronic anal fissure  PROCEDURE: . Lateral internal anal sphincterotomy  ANESTHESIA:  General  SURGEON: Renda Rolls  MD   INDICATIONS: . She is a history of anal pain which particularly occurs with a bowel movement and for about 2 hours after a bowel movement. She did have findings of a posterior anal fissure. She had a trial of treatment with nifedipine compound and initially seemed to improve but recently has had recurrence of pain. Surgery was recommended for definitive treatment.  The patient was placed on the operating table under general anesthesia the legs were placed into bumblebee stirrups and elevated into a lithotomy position the anal area was prepared with Betadine solution and draped with sterile towels and sheets. Initial inspection revealed a posterior anal fissure. Digital exam demonstrated tightness of the sphincter with difficulty inserting a finger. The anoderm on the patient's left side was infiltrated with half percent Sensorcaine with epinephrine. The anal canal was dilated large enough to admit 2 fingers. The bivalve anal retractor was introduced and further dilated the internal anal sphincter. There was some minimal enlargement of internal hemorrhoids noted. No polyps or tumors were seen. One assistant held the anal retractor. Another assistant retracted the buttock on the patient's left side. An incision was made at the 9:00 position some 18 mm in length several small bleeding points were cauterized. Hemostats were used to bluntly dissect to expose the internal anal sphincter which was recognized benefits white color. 90% of the internal sphincter was divided with electrocautery. There was some minimal degree of oozing following this. Several small bleeding points were cauterized hemostasis was surgically intact. The wound was closed with interrupted 3-0 chromic sutures leaving a  small opening at the distal and for drainage. The bivalve anal retractor was removed an index finger could be easily inserted into the anal canal. Dressings were applied with paper tape. After extubation the patient was maneuvered over to a stretcher using the Hover mat.. She appeared to be in satisfactory condition for transfer to the recovery room.  This operation required more time and more than the the usual amount of effort due to morbid obesity, difficulty maneuvering into the lithotomy position and maintaining exposure and illumination.

## 2016-09-25 NOTE — Anesthesia Preprocedure Evaluation (Addendum)
Anesthesia Evaluation  Patient identified by MRN, date of birth, ID band Patient awake    Reviewed: Allergy & Precautions, NPO status , Patient's Chart, lab work & pertinent test results  History of Anesthesia Complications (+) Family history of anesthesia reaction  Airway Mallampati: IV  TM Distance: >3 FB Neck ROM: Full    Dental   Pulmonary asthma , sleep apnea ,           Cardiovascular hypertension, Pt. on medications      Neuro/Psych  Headaches, negative psych ROS   GI/Hepatic Neg liver ROS, hiatal hernia, GERD  Medicated,  Endo/Other  Morbid obesity  Renal/GU negative Renal ROS     Musculoskeletal  (+) Arthritis , Osteoarthritis,    Abdominal   Peds negative pediatric ROS (+)  Hematology  (+) anemia ,   Anesthesia Other Findings   Reproductive/Obstetrics                            Anesthesia Physical Anesthesia Plan  ASA: III  Anesthesia Plan: General   Post-op Pain Management:    Induction: Intravenous, Rapid sequence and Cricoid pressure planned  PONV Risk Score and Plan: 4 or greater and Ondansetron, Dexamethasone, Propofol, Midazolam, Scopolamine patch - Pre-op and Treatment may vary due to age or medical condition  Airway Management Planned: Oral ETT  Additional Equipment:   Intra-op Plan:   Post-operative Plan: Extubation in OR  Informed Consent: I have reviewed the patients History and Physical, chart, labs and discussed the procedure including the risks, benefits and alternatives for the proposed anesthesia with the patient or authorized representative who has indicated his/her understanding and acceptance.   Dental advisory given  Plan Discussed with: CRNA and Surgeon  Anesthesia Plan Comments:         Anesthesia Quick Evaluation

## 2016-09-25 NOTE — Anesthesia Procedure Notes (Signed)
Procedure Name: Intubation Date/Time: 09/25/2016 3:48 PM Performed by: Doreen Salvage Pre-anesthesia Checklist: Patient identified, Emergency Drugs available, Suction available and Patient being monitored Patient Re-evaluated:Patient Re-evaluated prior to inductionOxygen Delivery Method: Circle system utilized Preoxygenation: Pre-oxygenation with 100% oxygen Intubation Type: IV induction, Cricoid Pressure applied and Rapid sequence Ventilation: Mask ventilation without difficulty Laryngoscope Size: Mac, 3 and McGraph Grade View: Grade II Tube type: Oral Tube size: 7.0 mm Number of attempts: 1 Airway Equipment and Method: Stylet Placement Confirmation: ETT inserted through vocal cords under direct vision,  positive ETCO2 and breath sounds checked- equal and bilateral Secured at: 21 cm Tube secured with: Tape Dental Injury: Teeth and Oropharynx as per pre-operative assessment

## 2016-09-25 NOTE — Anesthesia Post-op Follow-up Note (Cosign Needed)
Anesthesia QCDR form completed.        

## 2016-09-25 NOTE — Discharge Instructions (Addendum)
Take stool softeners and MiraLAX to help avoid constipation.  If stools become too loose can discontinue MiraLAX.  Take Tylenol or Norco if needed for pain.  Should not drive or do anything dangerous when taking Norco.  Should not take Norco and tramadol at the same time.  Remove dressing either later today or tomorrow.  May shower and/or sit in warm water.   AMBULATORY SURGERY  DISCHARGE INSTRUCTIONS   1) The drugs that you were given will stay in your system until tomorrow so for the next 24 hours you should not:  A) Drive an automobile B) Make any legal decisions C) Drink any alcoholic beverage   2) You may resume regular meals tomorrow.  Today it is better to start with liquids and gradually work up to solid foods.  You may eat anything you prefer, but it is better to start with liquids, then soup and crackers, and gradually work up to solid foods.   3) Please notify your doctor immediately if you have any unusual bleeding, trouble breathing, redness and pain at the surgery site, drainage, fever, or pain not relieved by medication.    4) Additional Instructions:        Please contact your physician with any problems or Same Day Surgery at [phone], Monday through Friday 6 am to 4 pm, or [place] at [facility] number at [phone].  Tuck gauze or pad in underwear as needed for drainage.

## 2016-09-25 NOTE — Transfer of Care (Signed)
Immediate Anesthesia Transfer of Care Note  Patient: Claudia Hoffman  Procedure(s) Performed: Procedure(s): SPHINCTEROTOMY-LATERAL INTERNAL (N/A)  Patient Location: PACU  Anesthesia Type:General  Level of Consciousness: sedated  Airway & Oxygen Therapy: Patient Spontanous Breathing and Patient connected to face mask oxygen  Post-op Assessment: Report given to RN and Post -op Vital signs reviewed and stable  Post vital signs: Reviewed and stable  Last Vitals:  Vitals:   09/25/16 1248 09/25/16 1702  BP: 137/75 133/63  Pulse: 89 (!) 103  Resp: (!) 22 17  Temp: 37.2 C 37.4 C    Complications: No apparent anesthesia complications

## 2016-09-25 NOTE — H&P (Signed)
  She reports no change in condition since the office visit.  Lab work reviewed  I discussed the plan for lateral internal anal sphincterotomy.

## 2016-09-26 ENCOUNTER — Encounter: Payer: Self-pay | Admitting: Surgery

## 2016-09-27 NOTE — Anesthesia Postprocedure Evaluation (Signed)
Anesthesia Post Note  Patient: Bevelyn Buckles  Procedure(s) Performed: Procedure(s) (LRB): SPHINCTEROTOMY-LATERAL INTERNAL (N/A)  Patient location during evaluation: PACU Anesthesia Type: General Level of consciousness: awake and alert Pain management: pain level controlled Vital Signs Assessment: post-procedure vital signs reviewed and stable Respiratory status: spontaneous breathing, nonlabored ventilation, respiratory function stable and patient connected to nasal cannula oxygen Cardiovascular status: blood pressure returned to baseline and stable Postop Assessment: no signs of nausea or vomiting Anesthetic complications: no     Last Vitals:  Vitals:   09/25/16 1840 09/25/16 1910  BP: (!) 133/91 132/89  Pulse: 96 96  Resp: 14 14  Temp:  36.4 C    Last Pain:  Vitals:   09/25/16 1910  TempSrc:   PainSc: 4                  Yevette Edwards

## 2017-01-26 ENCOUNTER — Telehealth: Payer: Self-pay | Admitting: Podiatry

## 2017-01-26 MED ORDER — GABAPENTIN 300 MG PO CAPS: 300.0000 mg | ORAL_CAPSULE | Freq: Three times a day (TID) | ORAL | 0 refills | Status: DC

## 2017-01-26 MED ORDER — GABAPENTIN 300 MG PO CAPS
300.0000 mg | ORAL_CAPSULE | Freq: Three times a day (TID) | ORAL | 0 refills | Status: DC
Start: 2017-01-26 — End: 2018-09-21

## 2017-01-26 NOTE — Telephone Encounter (Signed)
I informed pt of the refill for 30 days and that Dr. Charlsie Merles wanted her to make an appt to make sure she was not still having a problem with the plantar fasciitis. Pt states understanding and I transferred to schedulers.

## 2017-01-26 NOTE — Addendum Note (Signed)
Addended by: Alphia Kava D on: 01/26/2017 02:58 PM   Modules accepted: Orders

## 2017-01-26 NOTE — Telephone Encounter (Signed)
I was calling to get a refill on the gabapentin. I use the [facility] on [address] in [place]. My phone number is [phone]. Thank you.

## 2017-02-08 ENCOUNTER — Ambulatory Visit: Payer: BLUE CROSS/BLUE SHIELD | Admitting: Podiatry

## 2017-02-08 ENCOUNTER — Encounter: Payer: Self-pay | Admitting: Podiatry

## 2017-02-08 DIAGNOSIS — M722 Plantar fascial fibromatosis: Secondary | ICD-10-CM

## 2017-02-08 MED ORDER — GABAPENTIN 300 MG PO CAPS: 300.0000 mg | ORAL_CAPSULE | Freq: Three times a day (TID) | ORAL | 3 refills | Status: DC

## 2017-02-08 NOTE — Progress Notes (Signed)
Subjective:    Patient ID: Claudia Hoffman, female   DOB: 51 y.o.   MRN: [id number]   HPI patient presents stating my mid arch left has really been bothering me and the heel seems some better and the gabapentin helps but this left foot still is really sore    ROS      Objective:  Physical Exam neurovascular status intact with inflammation mid arch left with patient having obesity which is complicating factor and a very tight plantar fascial tendon     Assessment:   Continued chronic fasciitis with inflammation mid arch left      Plan:   H&P condition reviewed and recommended physical therapy for the left mid arch and also shockwave therapy of the mid arch and calf muscle and I did go over this case with Shanda Bumps and introduced her to [place]. Patient will undergo shockwave therapy will be seen back to recheck

## 2017-02-15 ENCOUNTER — Ambulatory Visit: Payer: BLUE CROSS/BLUE SHIELD

## 2017-05-26 ENCOUNTER — Ambulatory Visit
Admission: RE | Admit: 2017-05-26 | Discharge: 2017-05-26 | Disposition: A | Discharge status: Home / Self Care | Payer: BLUE CROSS/BLUE SHIELD | Source: Ambulatory Visit | Attending: Obstetrics and Gynecology | Admitting: Obstetrics and Gynecology

## 2017-05-26 DIAGNOSIS — Z1231 Encounter for screening mammogram for malignant neoplasm of breast: Secondary | ICD-10-CM | POA: Diagnosis not present

## 2017-05-26 DIAGNOSIS — Z01411 Encounter for gynecological examination (general) (routine) with abnormal findings: Secondary | ICD-10-CM

## 2017-08-25 ENCOUNTER — Ambulatory Visit (INDEPENDENT_AMBULATORY_CARE_PROVIDER_SITE_OTHER): Payer: BLUE CROSS/BLUE SHIELD | Admitting: Obstetrics and Gynecology

## 2017-08-25 ENCOUNTER — Encounter: Payer: Self-pay | Admitting: Obstetrics and Gynecology

## 2017-08-25 VITALS — BP 131/71 | HR 87 | Ht 62.0 in | Wt 384.2 lb

## 2017-08-25 DIAGNOSIS — Z01419 Encounter for gynecological examination (general) (routine) without abnormal findings: Secondary | ICD-10-CM | POA: Diagnosis not present

## 2017-08-25 NOTE — Progress Notes (Signed)
Subjective:   Claudia Hoffman is a 52 y.o. G65P0010 Caucasian female here for a routine well-woman exam.  No LMP recorded (lmp unknown). Patient is postmenopausal.    Current complaints: none PCP: [facility]     doesn't desire labs-PCP draws them.  Social History: Sexual: heterosexual Marital Status: divorced Living situation: alone Occupation: hairdresser Tobacco/alcohol: no tobacco use Illicit drugs: no history of illicit drug use  The following portions of the patient's history were reviewed and updated as appropriate: allergies, current medications, past family history, past medical history, past social history, past surgical history and problem list.  Past Medical History Past Medical History:  Diagnosis Date  . Allergy   . Anemia   . Arthritis    osteo-knees, shoulders  . Asthma    well controlled  . Atopic dermatitis   . Family history of adverse reaction to anesthesia    mom-nauseated after surgery  . Headache    migraines-1-2 per year  . Hypertension   . Obesity, Class III, BMI 40-49.9 (morbid obesity) (HCC) 11/2012   s/p bariatric sleeve surgery by Dr. Smitty Cords   . Sleep apnea    no cpap    Past Surgical History Past Surgical History:  Procedure Laterality Date  . BAND HEMORRHOIDECTOMY  06/2016   DONE IN [facility] OFFICE  . CESAREAN SECTION  1997  . CHOLECYSTECTOMY  05/2012  . GASTRIC BYPASS  11/2012   sleeve  . IUD REMOVAL    . SPHINCTEROTOMY N/A 09/25/2016   Procedure: [place] INTERNAL;  Surgeon: Nadeen Landau, MD;  Location: [facility] ORS;  Service: General;  Laterality: N/A;    Gynecologic History G2P0010  No LMP recorded (lmp unknown). Patient is postmenopausal. Contraception: post menopausal status Last Pap: 07/2016. Results were: normal Last mammogram: 20/2019. Results were: normal   Obstetric History OB History  Gravida Para Term Preterm AB Living  2 1     1     SAB TAB Ectopic Multiple Live Births               #  Outcome Date GA Lbr Len/2nd Weight Sex Delivery Anes PTL Lv  2 AB           1 Para             Current Medications Current Outpatient Medications on File Prior to Visit  Medication Sig Dispense Refill  . acetaminophen (TYLENOL) 650 MG CR tablet Take 1,300 mg by mouth daily as needed for pain.    albuterol (PROVENTIL HFA;VENTOLIN HFA) 108 (90 Base) MCG/ACT inhaler Inhale 2 puffs into the lungs every 6 (six) hours as needed for wheezing or shortness of breath.    Marland Kitchen amitriptyline (ELAVIL) 50 MG tablet Take 50 mg by mouth every morning.   1  . Black Cohosh 540 MG CAPS Take 540 mg by mouth daily.    Marland Kitchen docusate sodium (COLACE) 100 MG capsule Take 200 mg by mouth 2 (two) times daily.    Marland Kitchen gabapentin (NEURONTIN) 300 MG capsule Take 1 capsule (300 mg total) by mouth 3 (three) times daily. 90 capsule 3  . ibuprofen (ADVIL,MOTRIN) 200 MG tablet Take 800 mg by mouth daily as needed for mild pain.    Marland Kitchen losartan-hydrochlorothiazide (HYZAAR) 100-12.5 MG tablet Take 1 tablet by mouth daily.  1  . gabapentin (NEURONTIN) 300 MG capsule Take 1 capsule (300 mg total) by mouth 3 (three) times daily. (Patient not taking: Reported on 08/25/2017) 90 capsule 0  . gabapentin (  NEURONTIN) 300 MG capsule Take 1 capsule (300 mg total) 3 (three) times daily by mouth. (Patient not taking: Reported on 08/25/2017) 90 capsule 3  . HYDROcodone-acetaminophen (NORCO) 5-325 MG tablet Take 1-2 tablets by mouth every 4 (four) hours as needed for moderate pain. (Patient not taking: Reported on 08/25/2017) 12 tablet 0  . polyethylene glycol powder (GLYCOLAX/MIRALAX) powder Take 17 g by mouth daily as needed for mild constipation.    Marland Kitchen terconazole (TERAZOL 7) 0.4 % vaginal cream Place 1 applicator vaginally at bedtime. (Patient not taking: Reported on 08/25/2017) 45 g 0  . traMADol (ULTRAM) 50 MG tablet Take 50 mg by mouth every 6 (six) hours as needed for moderate pain.     No current facility-administered medications on file prior to  visit.     Review of Systems Patient denies any headaches, blurred vision, shortness of breath, chest pain, abdominal pain, problems with bowel movements, urination, or intercourse.  Objective:  BP 131/71   Pulse 87   Ht 5\' 2"  (1.575 m)   Wt (!) 384 lb 3 oz (174.3 kg)   LMP  (LMP Unknown)   BMI 70.27 kg/m  Physical Exam  General:  Well developed, well nourished, no acute distress. She is alert and oriented x3. Skin:  Warm and dry Neck:  Midline trachea, no thyromegaly or nodules Cardiovascular: Regular rate and rhythm, no murmur heard Lungs:  Effort normal, all lung fields clear to auscultation bilaterally Breasts:  No dominant palpable mass, retraction, or nipple discharge Abdomen:  Soft, non tender, no hepatosplenomegaly or masses Pelvic:  External genitalia is normal in appearance.  The vagina is normal in appearance. The cervix is bulbous, no CMT.  Thin prep pap is not done. Uterus is not palpated due to body habitus but no obvious masses noted.  No adnexal masses or tenderness noted. Extremities:  No swelling noted Psych:  She has a normal mood and affect  Assessment:   Healthy well-woman exam Morbid obesity S/P menopause  Plan:   F/U 1 year for AE, or sooner if needed  Colonoscopy due in July  Marlow Berenguer N Liv Rallis, 03-10-1974

## 2017-08-25 NOTE — Patient Instructions (Signed)
  Place annual gynecologic exam patient instructions here.  Thank you for enrolling in MyChart. Please follow the instructions below to securely access your online medical record. MyChart allows you to send messages to your doctor, view your test results, manage appointments, and more.   How Do I Sign Up? 1. In your Internet browser, go to [employer] and enter [email or web address]. 2. Click on the Sign Up Now link in the Sign In box. You will see the New Member Sign Up page. 3. Enter your MyChart Access Code exactly as it appears below. You will not need to use this code after you've completed the sign-up process. If you do not sign up before the expiration date, you must request a new code.  MyChart Access Code: [email or web address] Expires: 10/09/2017  8:52 AM  4. Enter your Social Security Number ([id number]) and Date of Birth (mm/dd/yyyy) as indicated and click Submit. You will be taken to the next sign-up page. 5. Create a MyChart ID. This will be your MyChart login ID and cannot be changed, so think of one that is secure and easy to remember. 6. Create a MyChart password. You can change your password at any time. 7. Enter your Password Reset Question and Answer. This can be used at a later time if you forget your password.  8. Enter your e-mail address. You will receive e-mail notification when new information is available in MyChart. 9. Click Sign Up. You can now view your medical record.   Additional Information Remember, MyChart is NOT to be used for urgent needs. For medical emergencies, dial 911.

## 2017-09-02 ENCOUNTER — Other Ambulatory Visit: Payer: Self-pay

## 2017-09-06 ENCOUNTER — Other Ambulatory Visit: Payer: Self-pay

## 2017-09-06 DIAGNOSIS — Z1211 Encounter for screening for malignant neoplasm of colon: Secondary | ICD-10-CM

## 2017-09-14 ENCOUNTER — Telehealth: Payer: Self-pay | Admitting: Gastroenterology

## 2017-09-14 NOTE — Telephone Encounter (Signed)
Patient called to cancel her procedure and stated she will call us back to reschedule. I called [facility] to let them know.

## 2017-09-20 ENCOUNTER — Encounter: Admission: RE | Payer: Self-pay | Source: Ambulatory Visit

## 2017-09-20 ENCOUNTER — Ambulatory Visit
Admission: RE | Admit: 2017-09-20 | Payer: BLUE CROSS/BLUE SHIELD | Source: Ambulatory Visit | Admitting: Gastroenterology

## 2017-09-20 SURGERY — COLONOSCOPY WITH PROPOFOL
Anesthesia: General

## 2017-12-25 ENCOUNTER — Other Ambulatory Visit: Payer: Self-pay | Admitting: Podiatry

## 2018-07-06 ENCOUNTER — Other Ambulatory Visit: Payer: Self-pay | Admitting: Internal Medicine

## 2018-07-06 DIAGNOSIS — Z1231 Encounter for screening mammogram for malignant neoplasm of breast: Secondary | ICD-10-CM

## 2018-08-29 ENCOUNTER — Other Ambulatory Visit: Payer: Self-pay

## 2018-08-29 ENCOUNTER — Ambulatory Visit
Admission: RE | Admit: 2018-08-29 | Discharge: 2018-08-29 | Disposition: A | Discharge status: Home / Self Care | Payer: PRIVATE HEALTH INSURANCE | Source: Ambulatory Visit | Attending: Internal Medicine | Admitting: Internal Medicine

## 2018-08-29 DIAGNOSIS — Z1231 Encounter for screening mammogram for malignant neoplasm of breast: Secondary | ICD-10-CM | POA: Insufficient documentation

## 2018-08-31 ENCOUNTER — Ambulatory Visit (INDEPENDENT_AMBULATORY_CARE_PROVIDER_SITE_OTHER): Payer: PRIVATE HEALTH INSURANCE

## 2018-08-31 ENCOUNTER — Ambulatory Visit: Payer: PRIVATE HEALTH INSURANCE | Admitting: Podiatry

## 2018-08-31 ENCOUNTER — Encounter: Payer: Self-pay | Admitting: Podiatry

## 2018-08-31 ENCOUNTER — Other Ambulatory Visit: Payer: Self-pay

## 2018-08-31 VITALS — Temp 98.4°F

## 2018-08-31 DIAGNOSIS — G43821 Menstrual migraine, not intractable, with status migrainosus: Secondary | ICD-10-CM | POA: Insufficient documentation

## 2018-08-31 DIAGNOSIS — Z8709 Personal history of other diseases of the respiratory system: Secondary | ICD-10-CM | POA: Insufficient documentation

## 2018-08-31 DIAGNOSIS — M1990 Unspecified osteoarthritis, unspecified site: Secondary | ICD-10-CM | POA: Insufficient documentation

## 2018-08-31 DIAGNOSIS — L209 Atopic dermatitis, unspecified: Secondary | ICD-10-CM | POA: Insufficient documentation

## 2018-08-31 DIAGNOSIS — I10 Essential (primary) hypertension: Secondary | ICD-10-CM | POA: Insufficient documentation

## 2018-08-31 DIAGNOSIS — M722 Plantar fascial fibromatosis: Secondary | ICD-10-CM

## 2018-08-31 DIAGNOSIS — IMO0002 Reserved for concepts with insufficient information to code with codable children: Secondary | ICD-10-CM | POA: Insufficient documentation

## 2018-08-31 DIAGNOSIS — O039 Complete or unspecified spontaneous abortion without complication: Secondary | ICD-10-CM | POA: Insufficient documentation

## 2018-08-31 DIAGNOSIS — Z8639 Personal history of other endocrine, nutritional and metabolic disease: Secondary | ICD-10-CM | POA: Insufficient documentation

## 2018-08-31 MED ORDER — TRIAMCINOLONE ACETONIDE 10 MG/ML IJ SUSP
10.0000 mg | Freq: Once | INTRAMUSCULAR | Status: AC
  Administered 2018-08-31: 10 mg

## 2018-08-31 NOTE — Progress Notes (Signed)
Subjective:   Patient ID: Claudia Hoffman, female   DOB: 53 y.o.   MRN: [id number]   HPI Patient presents stating that she started working and is been working extreme hours and patient does have extreme obesity which is a complicating factor with pain in the mid arch area left lateral side that is quite inflamed and was doing well for a significant period of time but is gotten bad again   ROS      Objective:  Physical Exam  Neurovascular status intact negative Homans sign noted with exquisite discomfort mid arch lateral side with inflammation fluid of the plantar fascial service     Assessment:  Acute mid arch fasciitis left     Plan:  H&P reviewed condition and went ahead and did a mid arch injection left 3 mg Kenalog 5 mg Xylocaine lateral side and then went ahead and applied a taping to lift up the arch with instructions on tapings at home but I gave to her caregiver.  Was given instructions for shoe gear modifications will be seen back 12 days  X-rays indicate there is collapse of the arch with no indications of other pathology

## 2018-09-07 ENCOUNTER — Encounter: Payer: BLUE CROSS/BLUE SHIELD | Admitting: Obstetrics and Gynecology

## 2018-09-12 ENCOUNTER — Ambulatory Visit: Payer: PRIVATE HEALTH INSURANCE | Admitting: Podiatry

## 2018-09-20 ENCOUNTER — Encounter: Payer: Self-pay | Admitting: Emergency Medicine

## 2018-09-20 ENCOUNTER — Emergency Department
Admission: EM | Admit: 2018-09-20 | Discharge: 2018-09-20 | Payer: PRIVATE HEALTH INSURANCE | Attending: Emergency Medicine | Admitting: Emergency Medicine

## 2018-09-20 ENCOUNTER — Inpatient Hospital Stay (HOSPITAL_COMMUNITY)
Admission: AD | Admit: 2018-09-20 | Discharge: 2018-09-26 | DRG: 177 | Disposition: A | Discharge status: Home / Self Care | Payer: PRIVATE HEALTH INSURANCE | Source: Other Acute Inpatient Hospital | Attending: Family Medicine | Admitting: Family Medicine

## 2018-09-20 ENCOUNTER — Encounter (HOSPITAL_COMMUNITY): Payer: Self-pay | Admitting: Internal Medicine

## 2018-09-20 ENCOUNTER — Emergency Department: Payer: PRIVATE HEALTH INSURANCE

## 2018-09-20 ENCOUNTER — Other Ambulatory Visit: Payer: Self-pay

## 2018-09-20 DIAGNOSIS — J9601 Acute respiratory failure with hypoxia: Secondary | ICD-10-CM | POA: Diagnosis present

## 2018-09-20 DIAGNOSIS — Z807 Family history of other malignant neoplasms of lymphoid, hematopoietic and related tissues: Secondary | ICD-10-CM

## 2018-09-20 DIAGNOSIS — E876 Hypokalemia: Secondary | ICD-10-CM | POA: Diagnosis present

## 2018-09-20 DIAGNOSIS — Z791 Long term (current) use of non-steroidal anti-inflammatories (NSAID): Secondary | ICD-10-CM

## 2018-09-20 DIAGNOSIS — Z9884 Bariatric surgery status: Secondary | ICD-10-CM | POA: Diagnosis not present

## 2018-09-20 DIAGNOSIS — R32 Unspecified urinary incontinence: Secondary | ICD-10-CM | POA: Diagnosis present

## 2018-09-20 DIAGNOSIS — Z8249 Family history of ischemic heart disease and other diseases of the circulatory system: Secondary | ICD-10-CM | POA: Diagnosis not present

## 2018-09-20 DIAGNOSIS — R0902 Hypoxemia: Secondary | ICD-10-CM | POA: Insufficient documentation

## 2018-09-20 DIAGNOSIS — J45909 Unspecified asthma, uncomplicated: Secondary | ICD-10-CM | POA: Diagnosis present

## 2018-09-20 DIAGNOSIS — G4733 Obstructive sleep apnea (adult) (pediatric): Secondary | ICD-10-CM | POA: Diagnosis present

## 2018-09-20 DIAGNOSIS — N179 Acute kidney failure, unspecified: Secondary | ICD-10-CM | POA: Diagnosis present

## 2018-09-20 DIAGNOSIS — Z79899 Other long term (current) drug therapy: Secondary | ICD-10-CM | POA: Insufficient documentation

## 2018-09-20 DIAGNOSIS — Z91018 Allergy to other foods: Secondary | ICD-10-CM | POA: Diagnosis not present

## 2018-09-20 DIAGNOSIS — Z9049 Acquired absence of other specified parts of digestive tract: Secondary | ICD-10-CM | POA: Diagnosis not present

## 2018-09-20 DIAGNOSIS — R0602 Shortness of breath: Secondary | ICD-10-CM | POA: Diagnosis present

## 2018-09-20 DIAGNOSIS — D508 Other iron deficiency anemias: Secondary | ICD-10-CM | POA: Diagnosis present

## 2018-09-20 DIAGNOSIS — Z79891 Long term (current) use of opiate analgesic: Secondary | ICD-10-CM

## 2018-09-20 DIAGNOSIS — J1282 Pneumonia due to coronavirus disease 2019: Secondary | ICD-10-CM | POA: Diagnosis present

## 2018-09-20 DIAGNOSIS — U071 COVID-19: Secondary | ICD-10-CM | POA: Diagnosis present

## 2018-09-20 DIAGNOSIS — I10 Essential (primary) hypertension: Secondary | ICD-10-CM | POA: Insufficient documentation

## 2018-09-20 DIAGNOSIS — Z6841 Body Mass Index (BMI) 40.0 and over, adult: Secondary | ICD-10-CM | POA: Diagnosis not present

## 2018-09-20 DIAGNOSIS — M069 Rheumatoid arthritis, unspecified: Secondary | ICD-10-CM | POA: Diagnosis present

## 2018-09-20 DIAGNOSIS — J1289 Other viral pneumonia: Secondary | ICD-10-CM | POA: Diagnosis present

## 2018-09-20 DIAGNOSIS — R509 Fever, unspecified: Secondary | ICD-10-CM | POA: Insufficient documentation

## 2018-09-20 DIAGNOSIS — K219 Gastro-esophageal reflux disease without esophagitis: Secondary | ICD-10-CM | POA: Diagnosis present

## 2018-09-20 LAB — COMPREHENSIVE METABOLIC PANEL
ALT: 14 U/L (ref 0–44)
AST: 19 U/L (ref 15–41)
Albumin: 3.5 g/dL (ref 3.5–5.0)
Alkaline Phosphatase: 75 U/L (ref 38–126)
Anion gap: 14 (ref 5–15)
BUN: 39 mg/dL — ABNORMAL HIGH (ref 6–20)
CO2: 27 mmol/L (ref 22–32)
Calcium: 8.6 mg/dL — ABNORMAL LOW (ref 8.9–10.3)
Chloride: 97 mmol/L — ABNORMAL LOW (ref 98–111)
Creatinine, Ser: 2.77 mg/dL — ABNORMAL HIGH (ref 0.44–1.00)
GFR calc Af Amer: 22 mL/min — ABNORMAL LOW (ref >60)
GFR calc non Af Amer: 19 mL/min — ABNORMAL LOW (ref >60)
Glucose, Bld: 107 mg/dL — ABNORMAL HIGH (ref 70–99)
Potassium: 2.5 mmol/L — CL (ref 3.5–5.1)
Sodium: 138 mmol/L (ref 135–145)
Total Bilirubin: 1.3 mg/dL — ABNORMAL HIGH (ref 0.3–1.2)
Total Protein: 7.6 g/dL (ref 6.5–8.1)

## 2018-09-20 LAB — CBC WITH DIFFERENTIAL/PLATELET
Abs Immature Granulocytes: 0.23 10*3/uL — ABNORMAL HIGH (ref 0.00–0.07)
Basophils Absolute: 0.1 10*3/uL (ref 0.0–0.1)
Basophils Relative: 1 %
Eosinophils Absolute: 0.1 10*3/uL (ref 0.0–0.5)
Eosinophils Relative: 1 %
HCT: 41.8 % (ref 36.0–46.0)
Hemoglobin: 13.6 g/dL (ref 12.0–15.0)
Immature Granulocytes: 2 %
Lymphocytes Relative: 9 %
Lymphs Abs: 1 10*3/uL (ref 0.7–4.0)
MCH: 27.6 pg (ref 26.0–34.0)
MCHC: 32.5 g/dL (ref 30.0–36.0)
MCV: 85 fL (ref 80.0–100.0)
Monocytes Absolute: 0.5 10*3/uL (ref 0.1–1.0)
Monocytes Relative: 4 %
Neutro Abs: 9.1 10*3/uL — ABNORMAL HIGH (ref 1.7–7.7)
Neutrophils Relative %: 83 %
Platelets: 288 10*3/uL (ref 150–400)
RBC: 4.92 MIL/uL (ref 3.87–5.11)
RDW: 16.7 % — ABNORMAL HIGH (ref 11.5–15.5)
WBC: 11 10*3/uL — ABNORMAL HIGH (ref 4.0–10.5)
nRBC: 0 % (ref 0.0–0.2)

## 2018-09-20 LAB — C-REACTIVE PROTEIN: CRP: 3.7 mg/dL — ABNORMAL HIGH (ref <1.0)

## 2018-09-20 LAB — LACTATE DEHYDROGENASE: LDH: 168 U/L (ref 98–192)

## 2018-09-20 LAB — FERRITIN: Ferritin: 132 ng/mL (ref 11–307)

## 2018-09-20 LAB — FIBRIN DERIVATIVES D-DIMER (ARMC ONLY): Fibrin derivatives D-dimer (ARMC): 565.23 ng/mL (FEU) — ABNORMAL HIGH (ref 0.00–499.00)

## 2018-09-20 LAB — TRIGLYCERIDES: Triglycerides: 138 mg/dL (ref <150)

## 2018-09-20 LAB — FIBRINOGEN: Fibrinogen: 507 mg/dL — ABNORMAL HIGH (ref 210–475)

## 2018-09-20 LAB — LACTIC ACID, PLASMA
Lactic Acid, Venous: 1.6 mmol/L (ref 0.5–1.9)
Lactic Acid, Venous: 1.1 mmol/L (ref 0.5–1.9)

## 2018-09-20 LAB — PROCALCITONIN: Procalcitonin: <0.1 ng/mL

## 2018-09-20 MED ORDER — SODIUM CHLORIDE 0.9 % IV SOLN
INTRAVENOUS | Status: DC
  Administered 2018-09-21: via INTRAVENOUS

## 2018-09-20 MED ORDER — POLYETHYLENE GLYCOL 3350 17 G PO PACK: 17.0000 g | PACK | Freq: Every day | ORAL | Status: DC | PRN

## 2018-09-20 MED ORDER — AMITRIPTYLINE HCL 50 MG PO TABS
50.0000 mg | ORAL_TABLET | ORAL | Status: DC
  Administered 2018-09-22 – 2018-09-26 (×5): 50 mg via ORAL
  Filled 2018-09-20 (×6): qty 1

## 2018-09-20 MED ORDER — ENOXAPARIN SODIUM 40 MG/0.4ML ~~LOC~~ SOLN
40.0000 mg | SUBCUTANEOUS | Status: DC
  Administered 2018-09-21: 10:00:00 40 mg via SUBCUTANEOUS
  Filled 2018-09-20: qty 0.4

## 2018-09-20 MED ORDER — ACETAMINOPHEN 325 MG PO TABS
650.0000 mg | ORAL_TABLET | Freq: Four times a day (QID) | ORAL | Status: DC | PRN
  Administered 2018-09-22: 650 mg via ORAL
  Filled 2018-09-20: qty 2

## 2018-09-20 MED ORDER — DOCUSATE SODIUM 100 MG PO CAPS
200.0000 mg | ORAL_CAPSULE | Freq: Two times a day (BID) | ORAL | Status: DC
  Administered 2018-09-23 – 2018-09-25 (×5): 200 mg via ORAL
  Filled 2018-09-20 (×10): qty 2

## 2018-09-20 MED ORDER — POTASSIUM CHLORIDE 10 MEQ/100ML IV SOLN
10.0000 meq | Freq: Once | INTRAVENOUS | Status: AC
  Administered 2018-09-20: 10 meq via INTRAVENOUS
  Filled 2018-09-20: qty 100

## 2018-09-20 MED ORDER — GUAIFENESIN-DM 100-10 MG/5ML PO SYRP: 10.0000 mL | ORAL_SOLUTION | ORAL | Status: DC | PRN

## 2018-09-20 MED ORDER — METHYLPREDNISOLONE SODIUM SUCC 40 MG IJ SOLR
40.0000 mg | Freq: Two times a day (BID) | INTRAMUSCULAR | Status: DC
  Administered 2018-09-21 – 2018-09-25 (×11): 40 mg via INTRAVENOUS
  Filled 2018-09-20 (×12): qty 1

## 2018-09-20 MED ORDER — ZINC OXIDE 40 % EX OINT
TOPICAL_OINTMENT | Freq: Two times a day (BID) | CUTANEOUS | Status: DC
  Administered 2018-09-21 – 2018-09-25 (×11): via TOPICAL
  Filled 2018-09-20 (×4): qty 57

## 2018-09-20 MED ORDER — AMITRIPTYLINE HCL 50 MG PO TABS
50.0000 mg | ORAL_TABLET | ORAL | Status: DC
  Filled 2018-09-20: qty 1

## 2018-09-20 MED ORDER — ALBUTEROL SULFATE HFA 108 (90 BASE) MCG/ACT IN AERS
2.0000 | INHALATION_SPRAY | Freq: Four times a day (QID) | RESPIRATORY_TRACT | Status: DC | PRN
  Administered 2018-09-21: 2 via RESPIRATORY_TRACT
  Filled 2018-09-20: qty 6.7

## 2018-09-20 MED ORDER — POTASSIUM CHLORIDE CRYS ER 20 MEQ PO TBCR
40.0000 meq | EXTENDED_RELEASE_TABLET | Freq: Two times a day (BID) | ORAL | Status: DC
  Administered 2018-09-21 (×2): 40 meq via ORAL
  Filled 2018-09-20 (×2): qty 2

## 2018-09-20 MED ORDER — TRAMADOL HCL 50 MG PO TABS
50.0000 mg | ORAL_TABLET | Freq: Four times a day (QID) | ORAL | Status: DC | PRN
  Administered 2018-09-21 – 2018-09-22 (×2): 50 mg via ORAL
  Filled 2018-09-20 (×2): qty 1

## 2018-09-20 MED ORDER — GABAPENTIN 300 MG PO CAPS: 300.0000 mg | ORAL_CAPSULE | Freq: Three times a day (TID) | ORAL | Status: DC

## 2018-09-20 NOTE — ED Notes (Signed)
Pt given warm blanket.

## 2018-09-20 NOTE — ED Notes (Signed)
[facility]  CALLED  FOR  TRANSFER 

## 2018-09-20 NOTE — ED Triage Notes (Signed)
Pt in via [place] from [facility] with c/o increased SOB and being +COVID. Pt was in 80's sats and was placed 3L

## 2018-09-20 NOTE — ED Provider Notes (Signed)
[facility] Emergency Department Provider Note  ____________________________________________  Time seen: Approximately 2:02 PM  I have reviewed the triage vital signs and the nursing notes.   HISTORY  Chief Complaint Shortness of Breath and Fever    HPI Claudia Hoffman is a 53 y.o. female who presents the emergency department from [place] clinic for evaluation of hypoxia in the setting of COVID and bilateral pneumonia.  Per the patient, she was diagnosed with COVID-19 as well as bilateral pneumonia.  Patient reports that she just finished a course of Levaquin.  She reports that symptomatically she has been improving but does not feel "quite where he should be at."  She states that she had a follow-up appointment at [facility] clinic today.  Patient reports that when she presented to the office, her O2 sat was in the mid 80s.  Noted clinic was concerned and sent the patient to the emergency department for evaluation.  Patient reports that she does have shortness of breath with exertion but that this is improving when compared with onset of symptoms.  Patient reports initially she was unable to get out of bed by herself, ambulate, "even make it to the bathroom without somebody helping me."  Patient reports that this has been improving.  She is able to get up on her own, ambulate through the house without difficulty.  Patient denies any headache, neck pain, nasal congestion, sore throat, chest pain, abdominal pain, nausea vomiting.  Patient does have a light residual nonproductive cough.  She does have improving exertional shortness of breath.  Patient does have a history of anemia, hypertension, GERD, morbid obesity, sleep apnea         Past Medical History:  Diagnosis Date  . Allergy   . Anemia   . Arthritis    osteo-knees, shoulders  . Asthma    well controlled  . Atopic dermatitis   . Family history of adverse reaction to anesthesia    mom-nauseated after  surgery  . Headache    migraines-1-2 per year  . Hypertension   . Obesity, Class III, BMI 40-49.9 (morbid obesity) (HCC) 11/2012   s/p bariatric sleeve surgery by Dr. Smitty Cords   . Sleep apnea    no cpap    Patient Active Problem List   Diagnosis Date Noted  . Abortion 08/31/2018  . Gravida 1 08/31/2018  . AD (atopic dermatitis) 08/31/2018  . Arthritis 08/31/2018  . H/O iron deficiency 08/31/2018  . History of hay fever 08/31/2018  . Essential hypertension 08/31/2018  . Menstrual migraine with status migrainosus, not intractable 08/31/2018  . Degenerative joint disease involving multiple joints on both sides of body 03/04/2016  . Chronic pain of multiple joints 10/08/2014  . Migraine without aura and without status migrainosus, not intractable 10/08/2014  . Status post bariatric surgery 10/08/2014  . Iron deficiency anemia due to dietary causes 10/08/2014  . GERD without esophagitis 10/08/2014  . Screening cholesterol level 10/08/2014  . Morbid obesity with BMI of 60.0-69.9, adult (HCC) 12/05/2012  . Obstructive sleep apnea of adult 12/05/2012  . Sliding hiatal hernia 12/05/2012    Past Surgical History:  Procedure Laterality Date  . BAND HEMORRHOIDECTOMY  06/2016   DONE IN [facility] OFFICE  . CESAREAN SECTION  1997  . CHOLECYSTECTOMY  05/2012  . GASTRIC BYPASS  11/2012   sleeve  . IUD REMOVAL    . SPHINCTEROTOMY N/A 09/25/2016   Procedure: [facility] INTERNAL;  Surgeon: Nadeen Landau, MD;  Location: [facility] ORS;  Service: General;  Laterality: N/A;    Prior to Admission medications   Medication Sig Start Date End Date Taking? Authorizing Provider  acetaminophen (TYLENOL) 650 MG CR tablet Take 1,300 mg by mouth daily as needed for pain.    [provider]  albuterol (PROVENTIL HFA;VENTOLIN HFA) 108 (90 Base) MCG/ACT inhaler Inhale 2 puffs into the lungs every 6 (six) hours as needed for wheezing or shortness of breath.    [provider]   amitriptyline (ELAVIL) 50 MG tablet Take 50 mg by mouth every morning.  08/23/16   [provider]  Black Cohosh 540 MG CAPS Take 540 mg by mouth daily.    [provider]  docusate sodium (COLACE) 100 MG capsule Take 200 mg by mouth 2 (two) times daily.    [provider]  gabapentin (NEURONTIN) 300 MG capsule Take 1 capsule (300 mg total) by mouth 3 (three) times daily. 06/15/16   Wallene Huh, DPM  gabapentin (NEURONTIN) 300 MG capsule Take 1 capsule (300 mg total) by mouth 3 (three) times daily. 01/26/17   Wallene Huh, DPM  gabapentin (NEURONTIN) 300 MG capsule TAKE ONE CAPSULE BY MOUTH THREE TIMES DAILY 12/27/17   Wallene Huh, DPM  ibuprofen (ADVIL,MOTRIN) 200 MG tablet Take 800 mg by mouth daily as needed for mild pain.    [provider]  losartan-hydrochlorothiazide (HYZAAR) 100-12.5 MG tablet Take 1 tablet by mouth daily. 08/22/16   [provider]  polyethylene glycol powder (GLYCOLAX/MIRALAX) powder Take 17 g by mouth daily as needed for mild constipation.    [provider]  terconazole (TERAZOL 7) 0.4 % vaginal cream Place 1 applicator vaginally at bedtime. 08/19/16   Shambley, Melody N, CNM  traMADol (ULTRAM) 50 MG tablet Take 50 mg by mouth every 6 (six) hours as needed for moderate pain.    [provider]    Allergies Other  Family History  Problem Relation Age of Onset  . Hypertension Mother   . Hypertension Father   . Heart disease Father   . Hodgkin's lymphoma Father 56  . Breast cancer Neg Hx     Social History Social History   Tobacco Use  . Smoking status: Never Smoker  . Smokeless tobacco: Never Used  Substance Use Topics  . Alcohol use: Yes    Alcohol/week: 0.0 standard drinks    Comment: rare currently-was a moderate alcohol user, but has not used in 49yrs  . Drug use: No     Review of Systems  Constitutional: No reported fever/chills Eyes: No visual changes. No discharge ENT: No  upper respiratory complaints. Cardiovascular: no chest pain. Respiratory: Improving nonproductive cough.  Improving exertional SOB. Gastrointestinal: No abdominal pain.  No nausea, no vomiting.  No diarrhea.  No constipation. Genitourinary: Negative for dysuria. No hematuria Musculoskeletal: Negative for musculoskeletal pain. Skin: Negative for rash, abrasions, lacerations, ecchymosis. Neurological: Negative for headaches, focal weakness or numbness. 10-point ROS otherwise negative.  ____________________________________________   PHYSICAL EXAM:  VITAL SIGNS: ED Triage Vitals  Enc Vitals Group     BP 09/20/18 1343 136/68     Pulse Rate 09/20/18 1343 (!) 102     Resp 09/20/18 1343 20     Temp 09/20/18 1343 98 F (36.7 C)     Temp Source 09/20/18 1343 Oral     SpO2 09/20/18 1343 96 %     Weight 09/20/18 1342 (!) 384 lb (174.2 kg)     Height 09/20/18 1342 5\' 2"  (1.575  m)     Head Circumference --      Peak Flow --      Pain Score 09/20/18 1342 0     Pain Loc --      Pain Edu? --      Excl. in GC? --      Constitutional: Alert and oriented. Well appearing and in no acute distress.  Patient is able to talk in complete sentences with no difficulty. Eyes: Conjunctivae are normal. PERRL. EOMI. Head: Atraumatic. ENT:      Ears:       Nose: No congestion/rhinnorhea.      Mouth/Throat: Mucous membranes are moist.  Oropharynx is nonerythematous and nonedematous.  Use midline. Neck: No stridor.  Neck is supple full range of motion Hematological/Lymphatic/Immunilogical: No cervical lymphadenopathy. Cardiovascular: Normal rate, regular rhythm. Normal S1 and S2.  Good peripheral circulation. Respiratory: Normal respiratory effort without tachypnea or retractions. Lungs with minimal expiratory wheezes in bilateral lower lung fields.  No rales or rhonchi identified.Peri [place]. Good air entry to the bases with no decreased or absent breath sounds. Gastrointestinal: Bowel sounds 4 quadrants. Soft  and nontender to palpation. No guarding or rigidity. No palpable masses. No distention. No CVA tenderness. Musculoskeletal: Full range of motion to all extremities. No gross deformities appreciated. Neurologic:  Normal speech and language. No gross focal neurologic deficits are appreciated.  Skin:  Skin is warm, dry and intact. No rash noted. Psychiatric: Mood and affect are normal. Speech and behavior are normal. Patient exhibits appropriate insight and judgement.   ____________________________________________   LABS (all labs ordered are listed, but only abnormal results are displayed)  Labs Reviewed  CBC WITH DIFFERENTIAL/PLATELET - Abnormal; Notable for the following components:      Result Value   WBC 11.0 (*)    RDW 16.7 (*)    Neutro Abs 9.1 (*)    Abs Immature Granulocytes 0.23 (*)    All other components within normal limits  COMPREHENSIVE METABOLIC PANEL - Abnormal; Notable for the following components:   Potassium 2.5 (*)    Chloride 97 (*)    Glucose, Bld 107 (*)    BUN 39 (*)    Creatinine, Ser 2.77 (*)    Calcium 8.6 (*)    Total Bilirubin 1.3 (*)    GFR calc non Af Amer 19 (*)    GFR calc Af Amer 22 (*)    All other components within normal limits  FIBRIN DERIVATIVES D-DIMER ([facility] ONLY) - Abnormal; Notable for the following components:   Fibrin derivatives D-dimer [facility] 565.23 (*)    All other components within normal limits  FIBRINOGEN - Abnormal; Notable for the following components:   Fibrinogen 507 (*)    All other components within normal limits  CULTURE, BLOOD (ROUTINE X 2)  CULTURE, BLOOD (ROUTINE X 2)  LACTIC ACID, PLASMA  LACTIC ACID, PLASMA  PROCALCITONIN  LACTATE DEHYDROGENASE  FERRITIN  TRIGLYCERIDES  C-REACTIVE PROTEIN   ____________________________________________  EKG   ____________________________________________  RADIOLOGY I personally viewed and evaluated these images as part of my medical decision making, as well as reviewing  the written report by the radiologist.  Dg Chest Port 1 View  Result Date: 09/20/2018 CLINICAL DATA:  Worsening shortness of breath. COVID-19 positive. EXAM: PORTABLE CHEST 1 VIEW COMPARISON:  Report from 09/20/2018 chest radiographs (images not available). Chest CTA 03/20/2016. FINDINGS: The cardiac silhouette is mildly enlarged. Patchy airspace opacities are present in the mid and lower lungs bilaterally, also described on today's earlier  study. Small pleural effusions are not excluded. No pneumothorax is identified. No acute osseous abnormality is seen. IMPRESSION: Bilateral airspace opacities consistent with pneumonia. Electronically Signed   By: Sebastian AcheAllen  Grady M.D.   On: 09/20/2018 14:54    ____________________________________________    PROCEDURES  Procedure(s) performed:    Procedures    Medications  potassium chloride 10 mEq in 100 mL IVPB (10 mEq Intravenous New Bag/Given 09/20/18 1633)     ____________________________________________   INITIAL IMPRESSION / ASSESSMENT AND PLAN / ED COURSE  Pertinent labs & imaging results that were available during my care of the patient were reviewed by me and considered in my medical decision making (see chart for details).  Review of the [place] CSRS was performed in accordance of the [facility] prior to dispensing any controlled drugs.  Clinical Course as of Sep 20 1702  Tue Sep 20, 2018  1406 Patient presented to the emergency department for [facility].  Patient was diagnosed 8 days ago with COVID-19 and bilateral pneumonia.  Patient completed a course of Levaquin for her pneumonia.  Patient reports that she has been improving symptomatically at the house.  Initially, patient was unable to ambulate without assistance.  She was unable to arise from bed.  Patient reports that she is now able to ambulate through her house without difficulty.  [facility] for re[facility]uation given her COVID-19  status as well as pneumonia.  Patient reports that when she arrived at the office, her O2 sats was in the mid 80s and she was sent to the emergency department for evaluation.  Symptomatically, patient states that she feels like she is improving.  Patient was able to converse in complete sentences.  Overall exam was reassuring.  Patient will have labs, checks x-ray in the emergency department.  Patient is on 2 L and saturating at 96%.  Patient will be trialed without oxygen to assess what her O2 saturation maintains at while at rest.   [JC]  1408 At this time, cover test would not be repeated as patient has a visible COVID-19 positive result 8 days ago [facility].   [JC]    Clinical Course User Index [JC] Cuthriell, Delorise RoyalsJonathan D, PA-C          Patient's diagnosis is consistent with given 19, hypoxia, hypokalemia.  Patient presented to the emergency department from her primary care provider's office.  Patient had been diagnosed with COVID-19, bilateral pneumonia 8 days prior.  Patient reports that she has finished her antibiotics but does not feel like she is improving to the extent that she should.  Patient presented to the emergency department from her primary care provider's office.  She was in her primary care provider's office for follow-up.  On assessment, patient had O2 saturations in the mid to low 80s.  Patient was placed on 3 L of oxygen via nasal cannula and improved to mid 90s for oxygen saturation.  Patient reports that she does feel like she has been improving in regards to exertional shortness of breath and symptoms.  On work-up, patient does have hypokalemia and was given potassium here in the emergency department.  Chest x-ray still reveals bilateral patchy opacities which is not to be totally unexpected as patient is just finished her antibiotics.  There were no significant adventitious lung sounds.  Patient is afebrile.  I discussed these results with the patient and her  symptoms.  During discussion about disposition, I advised that we would  stop her oxygen here in the emergency department and ambulate her.  Nasal cannula was disconnected, and patient within 3 to 5 minutes had desaturated to 83% without any activity. Patient placed on 4L [place] Given this finding, I recommend that patient be admitted.  Patient to be transferred to [facility].. I discussed the case with [facility] hospitalist, they accept the patient in transfer.    ____________________________________________  FINAL CLINICAL IMPRESSION(S) / ED DIAGNOSES  Final diagnoses:  COVID-19  Hypoxia  Hypokalemia      NEW MEDICATIONS STARTED DURING THIS VISIT:  ED Discharge Orders    None          This chart was dictated using voice recognition software/Dragon. Despite best efforts to proofread, errors can occur which can change the meaning. Any change was purely unintentional.    Racheal Patches, PA-C 09/20/18 1704    Shaune Pollack, MD 09/23/18 2216

## 2018-09-20 NOTE — H&P (Signed)
History and Physical    Claudia BucklesMonica H Hoffman ZOX:[id number] DOB: 12/08/1965 DOA: 09/20/2018  PCP: [facility]  Patient coming from: [facility] by [facility]  I have personally briefly reviewed patient's old medical records available.   Chief Complaint: Shortness of breath  HPI: Claudia Hoffman is a 53 y.o. female with medical history significant of morbid obesity, sleep apnea not using CPAP, hypertension, chronic pain syndrome who is presenting to the emergency room with shortness of breath and hypoxia measured at primary care physician's office on follow-up.  According to the patient, her first symptoms were low-grade fever and some dry cough that started about 2 weeks ago.  She visited her primary care physician on 09/12/2018 with the symptoms, COVID-19 was positive, she was sent home with 1 week of Levaquin.  Her fever improved.  Shortness of breath is overall improving.  Went back to her doctor's office today, was 84% on ambulation so sent to ER. Patient has been afebrile for more than a week.  Patient at baseline gets short of breath on walking across the parking lot that she attributes to obesity, she never used CPAP because she did not like it.  Patient is having 2 or 3 episodes of loose watery stool since last 1 week with no nausea or vomiting.  Patient does have perineal rash and also has incontinence with coughing.  She does have dry cough but no sputum. ED Course: Hemodynamically stable.  Blood pressures and heart rate are stable.96% saturation at rest.  84% on ambulation in the ER.  Chest x-ray shows bilateral diffuse infiltrates.  Creatinine elevated with previous normal creatinine.  Potassium 2.5.  Magnesium was normal. Patient was given 10 mEq of potassium chloride in the ER.  Transferred to [facility] for further management.  Review of Systems: As per HPI otherwise 10 point review of systems negative.    Past Medical History:  Diagnosis Date  . Allergy   .  Anemia   . Arthritis    osteo-knees, shoulders  . Asthma    well controlled  . Atopic dermatitis   . Family history of adverse reaction to anesthesia    mom-nauseated after surgery  . Headache    migraines-1-2 per year  . Hypertension   . Obesity, Class III, BMI 40-49.9 (morbid obesity) (HCC) 11/2012   s/p bariatric sleeve surgery by Dr. Smitty CordsBruce   . Sleep apnea    no cpap    Past Surgical History:  Procedure Laterality Date  . BAND HEMORR[facility]IDECTOMY  06/2016   [facility]  . CESAREAN SECTION  1997  . CHOLECYSTECTOMY  05/2012  . GASTRIC BYPASS  11/2012   sleeve  . IUD REMOVAL    . SPHINCTEROTOMY N/A 09/25/2016   Procedure: Tyrone NineSPHINCTEROTOMY-LATERAL INTERNAL;  Surgeon: Nadeen LandauSmith, Jarvis Wilton, MD;  Location: [facility] ORS;  Service: General;  Laterality: N/A;     reports that she has never smoked. She has never used smokeless tobacco. She reports current alcohol use. She reports that she does not use drugs.  Allergies  Allergen Reactions  . Other Other (See Comments)    LETTUCE, BEEF AND VANILLA CAUSE SNEEZING-DENIES ANY SWELLING OR SOB    Family History  Problem Relation Age of Onset  . Hypertension Mother   . Hypertension Father   . Heart disease Father   . Hodgkin's lymphoma Father [id number]  . Breast cancer Neg Hx      Prior to Admission medications   Medication Sig Start  Date End Date Taking? Authorizing Provider  acetaminophen (TYLENOL) 650 MG CR tablet Take 1,300 mg by mouth daily as needed for pain.   Yes [provider]  albuterol (PROVENTIL HFA;VENTOLIN HFA) 108 (90 Base) MCG/ACT inhaler Inhale 2 puffs into the lungs every 6 (six) hours as needed for wheezing or shortness of breath.   Yes [provider]  amitriptyline (ELAVIL) 50 MG tablet Take 50 mg by mouth every morning.  08/23/16  Yes [provider]  docusate sodium (COLACE) 100 MG capsule Take 200 mg by mouth 2 (two) times daily.   Yes [provider]  etodolac (LODINE) 500  MG tablet Take 500 mg by mouth 2 (two) times daily.   Yes [provider]  ibuprofen (ADVIL,MOTRIN) 200 MG tablet Take 800 mg by mouth daily as needed for mild pain.   Yes [provider]  losartan-hydrochlorothiazide (HYZAAR) 100-12.5 MG tablet Take 1 tablet by mouth daily. 08/22/16  Yes [provider]  polyethylene glycol powder (GLYCOLAX/MIRALAX) powder Take 17 g by mouth daily as needed for mild constipation.   Yes [provider]  Black Cohosh 540 MG CAPS Take 540 mg by mouth daily.    [provider]  gabapentin (NEURONTIN) 300 MG capsule Take 1 capsule (300 mg total) by mouth 3 (three) times daily. 06/15/16   Lenn Sink, DPM  gabapentin (NEURONTIN) 300 MG capsule Take 1 capsule (300 mg total) by mouth 3 (three) times daily. 01/26/17   Lenn Sink, DPM  gabapentin (NEURONTIN) 300 MG capsule TAKE ONE CAPSULE BY MOUTH THREE TIMES DAILY 12/27/17   Lenn Sink, DPM  terconazole (TERAZOL 7) 0.4 % vaginal cream Place 1 applicator vaginally at bedtime. 08/19/16   Shambley, Melody N, CNM  traMADol (ULTRAM) 50 MG tablet Take 50 mg by mouth every 6 (six) hours as needed for moderate pain.    [provider]    Physical Exam: There were no vitals filed for this visit.  Constitutional: NAD, calm, comfortable, comfortable on 4 L of oxygen.  Blood pressures are stable.  Morbidly obese. There were no vitals filed for this visit. Eyes: PERRL, lids and conjunctivae normal ENMT: Mucous membranes are moist. Posterior pharynx clear of any exudate or lesions.Normal dentition.  Neck: normal, supple, no masses, no thyromegaly Respiratory: clear to auscultation bilaterally, no wheezing, no crackles. Normal respiratory effort. No accessory muscle use.  Difficult to assess breath sounds. Cardiovascular: Regular rate and rhythm, no murmurs / rubs / gallops. No extremity edema. 2+ pedal pulses. No carotid bruits.  Abdomen: no tenderness, no masses  palpated. No hepatosplenomegaly. Bowel sounds positive.  Musculoskeletal: no clubbing / cyanosis. No joint deformity upper and lower extremities. Good ROM, no contractures. Normal muscle tone.  Skin: lesions, ulcers. No induration.  Patient has perineal rashes. Neurologic: CN 2-12 grossly intact. Sensation intact, DTR normal. Strength 5/5 in all 4.  Psychiatric: Normal judgment and insight. Alert and oriented x 3. Normal mood.     Labs on Admission: I have personally reviewed following labs and imaging studies  CBC: Recent Labs  Lab 09/20/18 1402  WBC 11.0*  NEUTROABS 9.1*  HGB 13.6  HCT 41.8  MCV 85.0  PLT 288   Basic Metabolic Panel: Recent Labs  Lab 09/20/18 1402  NA 138  K 2.5*  CL 97*  CO2 27  GLUCOSE 107*  BUN 39*  CREATININE 2.77*  CALCIUM 8.6*   GFR: Estimated Creatinine Clearance: 37 mL/min (A) (by C-G formula based on SCr  of 2.77 mg/dL (H)). Liver Function Tests: Recent Labs  Lab 09/20/18 1402  AST 19  ALT 14  ALKPHOS 75  BILITOT 1.3*  PROT 7.6  ALBUMIN 3.5   No results for input(s): LIPASE, AMYLASE in the last 168 hours. No results for input(s): AMMONIA in the last 168 hours. Coagulation Profile: No results for input(s): INR, PROTIME in the last 168 hours. Cardiac Enzymes: No results for input(s): CKTOTAL, CKMB, CKMBINDEX, TROPONINI in the last 168 hours. BNP (last 3 results) No results for input(s): PROBNP in the last 8760 hours. HbA1C: No results for input(s): HGBA1C in the last 72 hours. CBG: No results for input(s): GLUCAP in the last 168 hours. Lipid Profile: Recent Labs    09/20/18 1618  TRIG 138   Thyroid Function Tests: No results for input(s): TSH, T4TOTAL, FREET4, T3FREE, THYROIDAB in the last 72 hours. Anemia Panel: Recent Labs    09/20/18 1402  FERRITIN 132   Urine analysis: No results found for: COLORURINE, APPEARANCEUR, LABSPEC, PHURINE, GLUCOSEU, HGBUR, BILIRUBINUR, KETONESUR, PROTEINUR, UROBILINOGEN, NITRITE,  LEUKOCYTESUR  Radiological Exams on Admission: Dg Chest Port 1 View  Result Date: 09/20/2018 CLINICAL DATA:  Worsening shortness of breath. COVID-19 positive. EXAM: PORTABLE CHEST 1 VIEW COMPARISON:  Report from 09/20/2018 chest radiographs (images not available). Chest CTA 03/20/2016. FINDINGS: The cardiac silhouette is mildly enlarged. Patchy airspace opacities are present in the mid and lower lungs bilaterally, also described on today's earlier study. Small pleural effusions are not excluded. No pneumothorax is identified. No acute osseous abnormality is seen. IMPRESSION: Bilateral airspace opacities consistent with pneumonia. Electronically Signed   By: Sebastian Ache M.D.   On: 09/20/2018 14:54    EKG: Independently reviewed. Twelve-lead EKG shows normal sinus rhythm, T wave inversion on anterior and lateral leads.  Patient with no chest pain.  Assessment/Plan Principal Problem:   Pneumonia due to COVID-19 virus Active Problems:   Morbid obesity with BMI of 60.0-69.9, adult (HCC)   Obstructive sleep apnea of adult   Iron deficiency anemia due to dietary causes   GERD without esophagitis   Essential hypertension   Hypokalemia   Acute renal failure (ARF) (HCC)     1.  Pneumonia due to COVID-19 virus infection with hypoxia which is probably multifactorial: Agree with admission given severity of symptoms. COVID-19 onset more than 2 weeks, will treat for secondary bacterial infection with Rocephin and azithromycin. Chest physiotherapy, incentive spirometry, deep breathing exercises, sputum induction, mucolytic's and bronchodilators. Sputum cultures, blood cultures, Legionella and streptococcal antigen. Supplemental oxygen to keep saturations more than 90%. Patient has more than 2 weeks of symptoms, will less likely benefit with Tocilizumab and Remdesivir.  Patient is subjectively improving.  2.  Hypoxia and shortness of breath: Suspect multifactorial with acute pneumonia, untreated sleep  apnea and morbid obesity.  Keep on oxygen to keep saturation more than 90%.  Avoiding using CPAP.  I counseled patient to use CPAP at home consistently.  3.  Acute renal failure: With normal baseline creatinine.  Suspect because of ongoing diarrhea.  Will start patient on gentle IV fluids and monitor levels.  Hold lisinopril hydrochlorothiazide.  Hold all NSAID's.  4.  Hypokalemia: With ongoing diarrhea.  Replace IV and oral and recheck levels.  Also check magnesium and phosphorus.  5.  Hypertension: Blood pressures are stable.  I am holding all her hypertensive medications and hydrating.    DVT prophylaxis: Lovenox subcu Code Status: Full code Family Communication: None Disposition Plan: Home after hospitalization Consults called: None Admission  status: Inpatient telemetry   Barb Merino MD [facility] Pager [phone]  If 7PM-7AM, please contact night-coverage [email or web address] Password [facility]  09/21/2018, 12:17 AM

## 2018-09-20 NOTE — ED Notes (Addendum)
Date and time results received: 09/20/18 1448 (use smartphrase ".now" to insert current time)  Test: potassium Critical Value: 2.5  Name of Provider Notified: Cutrielle  Orders Received? Or Actions Taken?: Orders Received - See Orders for details

## 2018-09-20 NOTE — ED Notes (Signed)
EMTALA reviewed. 

## 2018-09-21 DIAGNOSIS — D508 Other iron deficiency anemias: Secondary | ICD-10-CM

## 2018-09-21 DIAGNOSIS — G4733 Obstructive sleep apnea (adult) (pediatric): Secondary | ICD-10-CM

## 2018-09-21 DIAGNOSIS — N179 Acute kidney failure, unspecified: Secondary | ICD-10-CM

## 2018-09-21 DIAGNOSIS — Z6841 Body Mass Index (BMI) 40.0 and over, adult: Secondary | ICD-10-CM

## 2018-09-21 DIAGNOSIS — I10 Essential (primary) hypertension: Secondary | ICD-10-CM

## 2018-09-21 DIAGNOSIS — K219 Gastro-esophageal reflux disease without esophagitis: Secondary | ICD-10-CM

## 2018-09-21 DIAGNOSIS — E876 Hypokalemia: Secondary | ICD-10-CM

## 2018-09-21 LAB — COMPREHENSIVE METABOLIC PANEL
ALT: 16 U/L (ref 0–44)
AST: 17 U/L (ref 15–41)
Albumin: 3.4 g/dL — ABNORMAL LOW (ref 3.5–5.0)
Alkaline Phosphatase: 78 U/L (ref 38–126)
Anion gap: 14 (ref 5–15)
BUN: 43 mg/dL — ABNORMAL HIGH (ref 6–20)
CO2: 26 mmol/L (ref 22–32)
Calcium: 8.4 mg/dL — ABNORMAL LOW (ref 8.9–10.3)
Chloride: 99 mmol/L (ref 98–111)
Creatinine, Ser: 2.77 mg/dL — ABNORMAL HIGH (ref 0.44–1.00)
GFR calc Af Amer: 22 mL/min — ABNORMAL LOW (ref >60)
GFR calc non Af Amer: 19 mL/min — ABNORMAL LOW (ref >60)
Glucose, Bld: 108 mg/dL — ABNORMAL HIGH (ref 70–99)
Potassium: 3.1 mmol/L — ABNORMAL LOW (ref 3.5–5.1)
Sodium: 139 mmol/L (ref 135–145)
Total Bilirubin: 0.9 mg/dL (ref 0.3–1.2)
Total Protein: 7.3 g/dL (ref 6.5–8.1)

## 2018-09-21 LAB — CK: Total CK: 165 U/L (ref 38–234)

## 2018-09-21 LAB — PHOSPHORUS: Phosphorus: 3.9 mg/dL (ref 2.5–4.6)

## 2018-09-21 LAB — CBC WITH DIFFERENTIAL/PLATELET
Abs Immature Granulocytes: 0.18 10*3/uL — ABNORMAL HIGH (ref 0.00–0.07)
Basophils Absolute: 0 10*3/uL (ref 0.0–0.1)
Basophils Relative: 0 %
Eosinophils Absolute: 0 10*3/uL (ref 0.0–0.5)
Eosinophils Relative: 0 %
HCT: 41.6 % (ref 36.0–46.0)
Hemoglobin: 12.9 g/dL (ref 12.0–15.0)
Immature Granulocytes: 2 %
Lymphocytes Relative: 5 %
Lymphs Abs: 0.5 10*3/uL — ABNORMAL LOW (ref 0.7–4.0)
MCH: 27.3 pg (ref 26.0–34.0)
MCHC: 31 g/dL (ref 30.0–36.0)
MCV: 88.1 fL (ref 80.0–100.0)
Monocytes Absolute: 0.2 10*3/uL (ref 0.1–1.0)
Monocytes Relative: 2 %
Neutro Abs: 9.7 10*3/uL — ABNORMAL HIGH (ref 1.7–7.7)
Neutrophils Relative %: 91 %
Platelets: 261 10*3/uL (ref 150–400)
RBC: 4.72 MIL/uL (ref 3.87–5.11)
RDW: 16.7 % — ABNORMAL HIGH (ref 11.5–15.5)
WBC: 10.6 10*3/uL — ABNORMAL HIGH (ref 4.0–10.5)
nRBC: 0 % (ref 0.0–0.2)

## 2018-09-21 LAB — URINALYSIS, ROUTINE W REFLEX MICROSCOPIC
Bilirubin Urine: NEGATIVE
Glucose, UA: NEGATIVE mg/dL
Hgb urine dipstick: NEGATIVE
Ketones, ur: NEGATIVE mg/dL
Leukocytes,Ua: NEGATIVE
Nitrite: NEGATIVE
Protein, ur: NEGATIVE mg/dL
Specific Gravity, Urine: 1.011 (ref 1.005–1.030)
pH: 5 (ref 5.0–8.0)

## 2018-09-21 LAB — EXPECTORATED SPUTUM ASSESSMENT W GRAM STAIN, RFLX TO RESP C

## 2018-09-21 LAB — HIV ANTIBODY (ROUTINE TESTING W REFLEX): HIV Screen 4th Generation wRfx: NONREACTIVE

## 2018-09-21 LAB — SODIUM, URINE, RANDOM: Sodium, Ur: 39 mmol/L

## 2018-09-21 LAB — C-REACTIVE PROTEIN: CRP: 3.5 mg/dL — ABNORMAL HIGH (ref <1.0)

## 2018-09-21 LAB — MRSA PCR SCREENING: MRSA by PCR: NEGATIVE

## 2018-09-21 LAB — PROTEIN / CREATININE RATIO, URINE
Creatinine, Urine: 150.69 mg/dL
Protein Creatinine Ratio: 0.1 mg/mg{Cre} (ref 0.00–0.15)
Total Protein, Urine: 15 mg/dL

## 2018-09-21 LAB — STREP PNEUMONIAE URINARY ANTIGEN: Strep Pneumo Urinary Antigen: NEGATIVE

## 2018-09-21 LAB — D-DIMER, QUANTITATIVE: D-Dimer, Quant: 0.37 ug/mL-FEU (ref 0.00–0.50)

## 2018-09-21 LAB — FERRITIN: Ferritin: 117 ng/mL (ref 11–307)

## 2018-09-21 LAB — MAGNESIUM: Magnesium: 2.4 mg/dL (ref 1.7–2.4)

## 2018-09-21 MED ORDER — ENOXAPARIN SODIUM 100 MG/ML ~~LOC~~ SOLN
85.0000 mg | SUBCUTANEOUS | Status: DC
  Filled 2018-09-21: qty 0.85

## 2018-09-21 MED ORDER — POTASSIUM CHLORIDE CRYS ER 20 MEQ PO TBCR
40.0000 meq | EXTENDED_RELEASE_TABLET | Freq: Two times a day (BID) | ORAL | Status: AC
  Administered 2018-09-21: 40 meq via ORAL
  Filled 2018-09-21: qty 2

## 2018-09-21 MED ORDER — ORAL CARE MOUTH RINSE
15.0000 mL | Freq: Two times a day (BID) | OROMUCOSAL | Status: DC
  Administered 2018-09-21 – 2018-09-25 (×10): 15 mL via OROMUCOSAL

## 2018-09-21 MED ORDER — AZITHROMYCIN 250 MG PO TABS
500.0000 mg | ORAL_TABLET | Freq: Every day | ORAL | Status: DC
  Administered 2018-09-21: 500 mg via ORAL
  Filled 2018-09-21: qty 2

## 2018-09-21 MED ORDER — SODIUM CHLORIDE 0.9 % IV SOLN
2.0000 g | INTRAVENOUS | Status: DC
  Administered 2018-09-21: 2 g via INTRAVENOUS
  Filled 2018-09-21: qty 20

## 2018-09-21 MED ORDER — ENOXAPARIN SODIUM 40 MG/0.4ML ~~LOC~~ SOLN
40.0000 mg | Freq: Once | SUBCUTANEOUS | Status: AC
  Administered 2018-09-21: 40 mg via SUBCUTANEOUS
  Filled 2018-09-21: qty 0.4

## 2018-09-21 MED ORDER — PNEUMOCOCCAL VAC POLYVALENT 25 MCG/0.5ML IJ INJ
0.5000 mL | INJECTION | INTRAMUSCULAR | Status: DC
  Filled 2018-09-21: qty 0.5

## 2018-09-21 MED ORDER — ENOXAPARIN SODIUM 100 MG/ML ~~LOC~~ SOLN
85.0000 mg | SUBCUTANEOUS | Status: DC
  Administered 2018-09-22 – 2018-09-25 (×4): 85 mg via SUBCUTANEOUS
  Filled 2018-09-21 (×6): qty 0.85

## 2018-09-21 NOTE — Progress Notes (Signed)
PROGRESS NOTE  Claudia Hoffman  JOI:[id number] DOB: 1965-08-01 DOA: 09/20/2018 PCP: [facility]   Brief Narrative: Claudia Hoffman is a 53 y.o. 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 She had been seen by her PCP several times, initially diagnosed with ear and sinus infection put on steroids, augmentin, and inhaler for a week, later changing to levaquin and azithromycin for 5 days. In the ED she was hypoxic and found to have renal failure with creatinine to 2.77 from last normal. She was admitted at [facility], started on steroids.   Assessment & Plan: Principal Problem:   Pneumonia due to COVID-19 virus Active Problems:   Morbid obesity with BMI of 60.0-69.9, adult (HCC)   Obstructive sleep apnea of adult   Iron deficiency anemia due to dietary causes   GERD without esophagitis   Essential hypertension   Hypokalemia   Acute renal failure (ARF) (HCC)  Covid-19 infection: Felt to be at risk of progression to ARDS based on current assessment. Comorbidities including HTN, obesity, asthma. - Continue airborne, contact precautions. PPE including surgical gown, gloves, face shield, cap, shoe covers, and N-95 used during this encounter in a negative pressure room.  - Check daily labs: CBC w/diff, CMP, d-dimer, fibrinogen, ferritin, LDH, CRP - Continue steroids - CrCl stable, LFTs wnl. If not improving or experiences progressive hypoxia would start remdesivir, possibly tocilizumab. Shared decision making at bedside today results in not starting these medications today.  - PCT undetectable and pt has had multiple courses of abx and now diarrhea. Will stop abx..  - Stop IVF's as pt is able to hydrate po and aiming to minimize volume expansion in covid.  - Avoid  NSAIDs including pt's home lodine.  - Recommend proning and aggressive use of incentive spirometry. - PT/OT to minimize deconditioning.    Acute renal failure: Creatinine 2.7 on admission up from 0.9 in April and prior. Due to dehydration, also possible AIN/nephrotoxicity of medications (abx, NSAIDs, and ARB) and possibly secondary to microthrombotic phenomenon from covid.  - Monitor creatinine - Avoid nephrotoxins, hold ARB, HCTZ. - Monitor I/O - If not improving with improved hydration, plan to check renal U/S.  - Check urinalysis, urine lytes.   Morbid obesity: BMI is 70. Recent physical in April 2020 showed LDL 97, HbA1c 5.9% - Weight loss long term is recommended.  - Supplement vitamins  Hypokalemia: Supplement and recheck in AM. Mg not low.   HTN:  - Hold medications with normotensive BPs  RA: Reported Dx, not yet established with rheumatology. ESR was 85.  - Avoid NSAIDs if possible  DVT prophylaxis: Will change to weight-based dosing of lovenox. D-dimer is wnl.  Code Status: Full Family Communication: None at bedside Disposition Plan: Home once improving. Remains hypoxic with renal failure.  Consultants:   None  Procedures:   None  Antimicrobials:  Ceftriaxone, azithromycin 6/24.    Subjective: Feels short of breath with exertion more than usual, making urine, having frequent loose stools but no abd pain and no fever. No chest pain.   Objective: Vitals:   09/20/18 2250 09/21/18 0425 09/21/18 0842  BP: (!) 160/74 107/68 97/74  Pulse:  88 96  Resp: (!) 23 (!) 21 16  Temp: 99.4 F (37.4 C) 98.3 F (36.8 C) 98.1  F (36.7 C)  TempSrc: Oral Oral Oral  SpO2: 96% 92% 90%  Weight: (!) 174 kg    Height: '5\' 2"'  (1.575 m)      Intake/Output Summary (Last 24 hours) at 09/21/2018 1107 Last data filed at 09/21/2018 0949 Gross per 24 hour  Intake 500.27 ml  Output 125 ml  Net 375.27 ml   Filed Weights   09/20/18 2250  Weight: (!) 174 kg    Gen: Pleasant,  obese female in no distress Pulm: Non-labored breathing 3L O2. Clear to auscultation bilaterally.  CV: Regular rate and rhythm. No murmur, rub, or gallop. No JVD, no significant pedal edema. GI: Abdomen soft, non-tender, non-distended, with normoactive bowel sounds. No organomegaly or masses felt. Ext: Warm, no deformities Skin: +venouse stasis hyperpigmentation bilateral lower legs. No rashes, lesions or ulcers Neuro: Alert and oriented. No focal neurological deficits. Psych: Judgement and insight appear normal. Mood & affect appropriate.   Data Reviewed: I have personally reviewed following labs and imaging studies  CBC: Recent Labs  Lab 09/20/18 1402 09/21/18 0345  WBC 11.0* 10.6*  NEUTROABS 9.1* 9.7*  HGB 13.6 12.9  HCT 41.8 41.6  MCV 85.0 88.1  PLT 288 536   Basic Metabolic Panel: Recent Labs  Lab 09/20/18 1402 09/21/18 0345  NA 138 139  K 2.5* 3.1*  CL 97* 99  CO2 27 26  GLUCOSE 107* 108*  BUN 39* 43*  CREATININE 2.77* 2.77*  CALCIUM 8.6* 8.4*  MG  --  2.4  PHOS  --  3.9   GFR: Estimated Creatinine Clearance: 37 mL/min (A) (by C-G formula based on SCr of 2.77 mg/dL (H)). Liver Function Tests: Recent Labs  Lab 09/20/18 1402 09/21/18 0345  AST 19 17  ALT 14 16  ALKPHOS 75 78  BILITOT 1.3* 0.9  PROT 7.6 7.3  ALBUMIN 3.5 3.4*   No results for input(s): LIPASE, AMYLASE in the last 168 hours. No results for input(s): AMMONIA in the last 168 hours. Coagulation Profile: No results for input(s): INR, PROTIME in the last 168 hours. Cardiac Enzymes: Recent Labs  Lab 09/21/18 0345  CKTOTAL 165   BNP (last 3 results) No results for input(s): PROBNP in the last 8760 hours. HbA1C: No results for input(s): HGBA1C in the last 72 hours. CBG: No results for input(s): GLUCAP in the last 168 hours. Lipid Profile: Recent Labs    09/20/18 1618  TRIG 138   Thyroid Function Tests: No results for input(s): TSH, T4TOTAL, FREET4, T3FREE, THYROIDAB in the last 72  hours. Anemia Panel: Recent Labs    09/20/18 1402 09/21/18 0345  FERRITIN 132 117   Urine analysis: No results found for: COLORURINE, APPEARANCEUR, LABSPEC, PHURINE, GLUCOSEU, HGBUR, BILIRUBINUR, KETONESUR, PROTEINUR, UROBILINOGEN, NITRITE, LEUKOCYTESUR Recent Results (from the past 240 hour(s))  Blood Culture (routine x 2)     Status: None (Preliminary result)   Collection Time: 09/20/18  2:00 PM   Specimen: BLOOD  Result Value Ref Range Status   Specimen Description   Final    BLOOD LEFT ANTECUBITAL Blood Culture adequate volume   Special Requests NONE  Final   Culture   Final    NO GROWTH < 24 HOURS Performed at [facility], [address] [place],  [address]    Report Status PENDING  Incomplete  Blood Culture (routine x 2)     Status: None (Preliminary result)   Collection Time: 09/20/18  4:18 PM   Specimen: BLOOD  Result Value Ref Range Status  Specimen Description BLOOD RIGHT ANTECUBITAL  Final   Special Requests   Final    BOTTLES DRAWN AEROBIC AND ANAEROBIC Blood Culture adequate volume   Culture   Final    NO GROWTH < 24 HOURS Performed at [facility], [address] [place], [place] [address]    Report Status PENDING  Incomplete  Culture, sputum-assessment     Status: None   Collection Time: 09/21/18 12:18 AM   Specimen: Sputum  Result Value Ref Range Status   Specimen Description SPUTUM  Final   Special Requests NONE  Final   Sputum evaluation   Final    THIS SPECIMEN IS ACCEPTABLE FOR SPUTUM CULTURE Performed at [facility], [place] [address] [place], [place] [address]    Report Status 09/21/2018 FINAL  Final  Culture, respiratory     Status: None (Preliminary result)   Collection Time: 09/21/18 12:18 AM   Specimen: SPU  Result Value Ref Range Status   Specimen Description   Final    SPUTUM Performed at [facility] [address], [place], [place] [address]    Special Requests    Final    NONE Reflexed from [id number] Performed at [facility], [place] [address] [place], [place] [address]    Gram Stain   Final    RARE WBC PRESENT,BOTH PMN AND MONONUCLEAR RARE GRAM POSITIVE COCCI Performed at [facility], [place] [address], [place], [place] [address]    Culture PENDING  Incomplete   Report Status PENDING  Incomplete  MRSA PCR Screening     Status: None   Collection Time: 09/21/18 12:47 AM   Specimen: Nasal Mucosa; Nasopharyngeal  Result Value Ref Range Status   MRSA by PCR NEGATIVE NEGATIVE Final    Comment:        The GeneXpert MRSA Assay (FDA approved for NASAL specimens only), is one component of a comprehensive MRSA colonization surveillance program. It is not intended to diagnose MRSA infection nor to guide or monitor treatment for MRSA infections. Performed at [facility], [place] [address], [place], [place] [address]       Radiology Studies: Dg Chest Port 1 View  Result Date: 09/20/2018 CLINICAL DATA:  Worsening shortness of breath. COVID-19 positive. EXAM: PORTABLE CHEST 1 VIEW COMPARISON:  Report from 09/20/2018 chest radiographs (images not available). Chest CTA 03/20/2016. FINDINGS: The cardiac silhouette is mildly enlarged. Patchy airspace opacities are present in the mid and lower lungs bilaterally, also described on today's earlier study. Small pleural effusions are not excluded. No pneumothorax is identified. No acute osseous abnormality is seen. IMPRESSION: Bilateral airspace opacities consistent with pneumonia. Electronically Signed   By: Logan Bores M.D.   On: 09/20/2018 14:54    Scheduled Meds: . [START ON 09/22/2018] amitriptyline  50 mg Oral [id number]  . docusate sodium  200 mg Oral BID  . enoxaparin (LOVENOX) injection  40 mg Subcutaneous Q24H  . liver oil-zinc oxide   Topical Q12H  . mouth rinse  15 mL Mouth Rinse BID  . methylPREDNISolone (SOLU-MEDROL) injection  40 mg Intravenous Q12H  . [START  ON 09/22/2018] pneumococcal 23 valent vaccine  0.5 mL Intramuscular Tomorrow-1000  . potassium chloride  40 mEq Oral BID   Continuous Infusions: . sodium chloride 75 mL/hr at 09/21/18 0000  . cefTRIAXone (ROCEPHIN)  IV 2 g (09/21/18 0130)     LOS: 1 day   Time spent: 35 minutes.  Patrecia Pour, MD [facility] [email or web address] Password [facility] 09/21/2018, 11:07 AM

## 2018-09-21 NOTE — Evaluation (Signed)
Physical Therapy Evaluation Patient Details Name: Claudia Hoffman MRN: [id number] DOB: 17-Sep-1965 Today's Date: 09/21/2018   History of Present Illness  53 y.o. female with medical history significant of morbid obesity, sleep apnea not using CPAP, hypertension, chronic pain syndrome who is presenting to the emergency room with shortness of breath and hypoxia measured at primary care physician's office on follow-up. Tesed COIVD positive.    Clinical Impression  Pt admitted with above diagnosis. Pt currently with functional limitations due to the deficits listed below (see PT Problem List). Patient significantly limited with her activity due to decr SaO2 (to 81%) on 3L [place] O2 and incr HR (max 148 briefly, generally 128 bpm). She requires near constant cues to limit her talking and use pursed lip breathing to recover to >88% SaO2. Attempted sitting in recliner with pillows under her (due to excoriated peri-area from frequent diarrhea--per RN). She could not tolerate and returned to chair like position in bed. Asked unit secretary to order an air cushion for her chair. Educated on proper use and frequency of incentive spirometer.  Pt will benefit from skilled PT to increase their independence and safety with mobility to allow discharge to the venue listed below.       Follow Up Recommendations No PT follow up    Equipment Recommendations  None recommended by PT    Recommendations for Other Services       Precautions / Restrictions Precautions Precautions: Fall;Other (comment)(Watch O2. )      Mobility  Bed Mobility Overal bed mobility: Needs Assistance Bed Mobility: Supine to Sit;Sit to Supine     Supine to sit: Min assist;HOB elevated Sit to supine: Min guard   General bed mobility comments: Min A to pull OT's hand and elevate trunk  Transfers Overall transfer level: Needs assistance Equipment used: None Transfers: Sit to/from Stand Sit to Stand: Min guard         General  transfer comment: Min Guard A for safety; uses wide BOS  Ambulation/Gait Ambulation/Gait assistance: Min guard Gait Distance (Feet): 15 Feet(5, 12 ) Assistive device: None Gait Pattern/deviations: Step-through pattern;Wide base of support(incr lateral wt-shift to advance each LE)     General Gait Details: SaO2 drops with ambulation on [place] O2 3L  Stairs            Wheelchair Mobility    Modified Rankin (Stroke Patients Only)       Balance Overall balance assessment: Mild deficits observed, not formally tested                                           Pertinent Vitals/Pain Pain Assessment: Faces Faces Pain Scale: Hurts whole lot Pain Location: peri area with pressure Pain Descriptors / Indicators: Constant;Burning Pain Intervention(s): Monitored during session;Limited activity within patient's tolerance;Repositioned    Home Living Family/patient expects to be discharged to:: Private residence Living Arrangements: Children(22 yo daughter) Available Help at Discharge: Family;Available 24 hours/day Type of Home: House Home Access: Stairs to enter Entrance Stairs-Rails: Psychiatric nurse of Steps: 4 Home Layout: One level Home Equipment: None      Prior Function Level of Independence: Independent         Comments: Hair dresser     Hand Dominance   Dominant Hand: Right    Extremity/Trunk Assessment   Upper Extremity Assessment Upper Extremity Assessment: Defer to OT evaluation  Lower Extremity Assessment Lower Extremity Assessment: Generalized weakness    Cervical / Trunk Assessment Cervical / Trunk Assessment: Other exceptions Cervical / Trunk Exceptions: morbid obesity  Communication   Communication: No difficulties  Cognition Arousal/Alertness: Awake/alert Behavior During Therapy: WFL for tasks assessed/performed Overall Cognitive Status: Within Functional Limits for tasks assessed                                         General Comments General comments (skin integrity, edema, etc.): SpO2 90-88% on 3L at rest. dropping to 81% on 3L and requiring rest break and cues for purse lip breathing    Exercises     Assessment/Plan    PT Assessment Patient needs continued PT services  PT Problem List Decreased activity tolerance;Decreased balance;Decreased mobility;Decreased knowledge of precautions;Cardiopulmonary status limiting activity;Obesity       PT Treatment Interventions Gait training;Functional mobility training;Therapeutic activities;Therapeutic exercise;Patient/family education    PT Goals (Current goals can be found in the Care Plan section)  Acute Rehab PT Goals Patient Stated Goal: Go home PT Goal Formulation: With patient Time For Goal Achievement: 10/05/18 Potential to Achieve Goals: Good    Frequency Min 3X/week   Barriers to discharge        Co-evaluation PT/OT/SLP Co-Evaluation/Treatment: Yes Reason for Co-Treatment: For patient/therapist safety PT goals addressed during session: Mobility/safety with mobility OT goals addressed during session: ADL's and self-care       AM-PAC PT "6 Clicks" Mobility  Outcome Measure Help needed turning from your back to your side while in a flat bed without using bedrails?: A Little Help needed moving from lying on your back to sitting on the side of a flat bed without using bedrails?: A Little Help needed moving to and from a bed to a chair (including a wheelchair)?: A Little Help needed standing up from a chair using your arms (e.g., wheelchair or bedside chair)?: A Little Help needed to walk in hospital room?: A Little Help needed climbing 3-5 steps with a railing? : A Little 6 Click Score: 18    End of Session Equipment Utilized During Treatment: Oxygen Activity Tolerance: Treatment limited secondary to medical complications (Comment)(decr SaO2 and incr HR) Patient left: in bed   PT Visit Diagnosis:  Difficulty in walking, not elsewhere classified (R26.2)    Time: 5465-0354 PT Time Calculation (min) (ACUTE ONLY): 40 min   Charges:   PT Evaluation $PT Eval Moderate Complexity: 1 Mod            [employer], PT 09/21/2018, 4:49 PM

## 2018-09-21 NOTE — Progress Notes (Signed)
ANTICOAGULATION CONSULT NOTE - Initial Consult  Pharmacy Consult for Lovenox Indication: VTE prophylaxis  Allergies  Allergen Reactions  . Other Other (See Comments)    LETTUCE, BEEF AND VANILLA CAUSE SNEEZING-DENIES ANY SWELLING OR SOB    Patient Measurements: Height: 5\' 2"  (157.5 cm) Weight: (!) 383 lb 9.6 oz (174 kg) IBW/kg (Calculated) : 50.1  Vital Signs: Temp: 98.1 F (36.7 C) (06/24 0842) Temp Source: Oral (06/24 0842) BP: 97/74 (06/24 0842) Pulse Rate: 96 (06/24 0842)  Labs: Recent Labs    09/20/18 1402 09/21/18 0345  HGB 13.6 12.9  HCT 41.8 41.6  PLT 288 261  CREATININE 2.77* 2.77*  CKTOTAL  --  165    Estimated Creatinine Clearance: 37 mL/min (A) (by C-G formula based on SCr of 2.77 mg/dL (H)).   Medical History: Past Medical History:  Diagnosis Date  . Allergy   . Anemia   . Arthritis    osteo-knees, shoulders  . Asthma    well controlled  . Atopic dermatitis   . Family history of adverse reaction to anesthesia    mom-nauseated after surgery  . Headache    migraines-1-2 per year  . Hypertension   . Obesity, Class III, BMI 40-49.9 (morbid obesity) ([place]) 11/2012   s/p bariatric sleeve surgery by Dr. Darnell Level   . Sleep apnea    no cpap    Assessment: 49 YOF who is COVID positive to start weight based Lovenox dose. H/H and Plt wnl. CrCl > 30 ml/min but is borderline.   Plan:  -Start Lovenox 0.5 mg/kg (85 mg) daily per COVID protocol -Monitor SCr and Roe, PharmD., BCPS Clinical Pharmacist Clinical phone for 09/21/18 until 5pm: [phone]

## 2018-09-21 NOTE — Evaluation (Signed)
Occupational Therapy Evaluation Patient Details Name: Claudia Hoffman MRN: [id number] DOB: [birth year] Today's Date: 09/21/2018    History of Present Illness 53 y.o. female with medical history significant of morbid obesity, sleep apnea not using CPAP, hypertension, chronic pain syndrome who is presenting to the emergency room with shortness of breath and hypoxia measured at primary care physician's office on follow-up. Tesed COIVD positive.   Clinical Impression   PTA, pt was living with her daughter and was independent and worked as a Emergency planning/management officer. Pt currently requiring Min A for UB ADLs, Mod-Max A for LB ADLs, and Min Guard A for functional mobility. Pt presenting with decreased acitivty tolerance as seen by fatigue and decreased SpO2 to 85% on 3-4L. Provided education on purse lip breathing and pt requiring rest breaks for SpO2 recovery. Pt would benefit from further acute OT to facilitate safe dc. Recommend dc to home once medically stable per physician.        Follow Up Recommendations  No OT follow up;Supervision - Intermittent    Equipment Recommendations  None recommended by OT    Recommendations for Other Services PT consult     Precautions / Restrictions Precautions Precautions: Fall;Other (comment)(Watch O2. )      Mobility Bed Mobility Overal bed mobility: Needs Assistance Bed Mobility: Supine to Sit;Sit to Supine     Supine to sit: Min assist;HOB elevated Sit to supine: Min guard   General bed mobility comments: Min A to pull OT's hand and elevate trunk  Transfers Overall transfer level: Needs assistance Equipment used: None Transfers: Sit to/from Stand Sit to Stand: Min guard         General transfer comment: Min Guard A for safety    Balance Overall balance assessment: Mild deficits observed, not formally tested                                         ADL either performed or assessed with clinical judgement   ADL Overall ADL's :  Needs assistance/impaired Eating/Feeding: Independent;Bed level   Grooming: Oral care;Brushing hair;Min guard;Standing   Upper Body Bathing: Minimal assistance;Sitting   Lower Body Bathing: Moderate assistance;Sit to/from stand   Upper Body Dressing : Min guard;Sitting   Lower Body Dressing: Maximal assistance;Sit to/from stand   Toilet Transfer: Min guard;Ambulation(Simulated to recliner)           Functional mobility during ADLs: Min guard General ADL Comments: Pt presenting with decreased activity tolerance as seen by decreased SpO2 and fatigue     Vision Baseline Vision/History: Wears glasses Patient Visual Report: No change from baseline       Perception     Praxis      Pertinent Vitals/Pain Pain Assessment: Faces Faces Pain Scale: Hurts whole lot Pain Location: peri area with pressure Pain Descriptors / Indicators: Constant;Burning Pain Intervention(s): Monitored during session;Limited activity within patient's tolerance;Repositioned     Hand Dominance Right   Extremity/Trunk Assessment Upper Extremity Assessment Upper Extremity Assessment: Generalized weakness   Lower Extremity Assessment Lower Extremity Assessment: Defer to PT evaluation   Cervical / Trunk Assessment Cervical / Trunk Assessment: Other exceptions Cervical / Trunk Exceptions: Increased body habitus   Communication Communication Communication: No difficulties   Cognition Arousal/Alertness: Awake/alert Behavior During Therapy: WFL for tasks assessed/performed Overall Cognitive Status: Within Functional Limits for tasks assessed  General Comments  SpO2 90-88% on 3L at rest. dropping to 85% on 3L and requiring rest break and cues for purse lip breathing    Exercises     Shoulder Instructions      Home Living Family/patient expects to be discharged to:: Private residence Living Arrangements: Children(22 yo daughter) Available  Help at Discharge: Family;Available 24 hours/day Type of Home: House Home Access: Stairs to enter [employer] of Steps: 4 Entrance Stairs-Rails: Right;Left Home Layout: One level     Bathroom Shower/Tub: Chief Strategy Officer: Handicapped height     Home Equipment: None          Prior Functioning/Environment Level of Independence: Independent        Comments: Hair dresser        OT Problem List: Decreased strength;Decreased range of motion;Decreased activity tolerance;Impaired balance (sitting and/or standing);Decreased knowledge of use of DME or AE;Decreased knowledge of precautions;Pain      OT Treatment/Interventions: Self-care/ADL training;Therapeutic exercise;Energy conservation;DME and/or AE instruction;Therapeutic activities;Patient/family education    OT Goals(Current goals can be found in the care plan section) Acute Rehab OT Goals Patient Stated Goal: Go home OT Goal Formulation: With patient Time For Goal Achievement: 10/05/18 Potential to Achieve Goals: Good  OT Frequency: Min 3X/week   Barriers to D/C:            Co-evaluation PT/OT/SLP Co-Evaluation/Treatment: Yes Reason for Co-Treatment: For patient/therapist safety;To address functional/ADL transfers   OT goals addressed during session: ADL's and self-care      AM-PAC OT "6 Clicks" Daily Activity     Outcome Measure Help from another person eating meals?: None Help from another person taking care of personal grooming?: A Little Help from another person toileting, which includes using toliet, bedpan, or urinal?: A Little Help from another person bathing (including washing, rinsing, drying)?: A Lot Help from another person to put on and taking off regular upper body clothing?: A Little Help from another person to put on and taking off regular lower body clothing?: A Lot 6 Click Score: 17   End of Session Equipment Utilized During Treatment: Oxygen Nurse Communication:  Mobility status  Activity Tolerance: Patient tolerated treatment well Patient left: in bed;with call bell/phone within reach  OT Visit Diagnosis: Unsteadiness on feet (R26.81);Other abnormalities of gait and mobility (R26.89);Muscle weakness (generalized) (M62.81)                Time: 1779-3903 OT Time Calculation (min): 40 min Charges:  OT General Charges $OT Visit: 1 Visit OT Evaluation $OT Eval Moderate Complexity: 1 Mod OT Treatments $Self Care/Home Management : 8-22 mins  Estreya Clay MSOT, OTR/L Acute Rehab Pager: [phone] Office: [phone]  Theodoro Grist Danney Bungert 09/21/2018, 3:22 PM

## 2018-09-22 LAB — CBC WITH DIFFERENTIAL/PLATELET
Abs Immature Granulocytes: 0.23 10*3/uL — ABNORMAL HIGH (ref 0.00–0.07)
Basophils Absolute: 0 10*3/uL (ref 0.0–0.1)
Basophils Relative: 0 %
Eosinophils Absolute: 0 10*3/uL (ref 0.0–0.5)
Eosinophils Relative: 0 %
HCT: 40.2 % (ref 36.0–46.0)
Hemoglobin: 12.2 g/dL (ref 12.0–15.0)
Immature Granulocytes: 2 %
Lymphocytes Relative: 5 %
Lymphs Abs: 0.7 10*3/uL (ref 0.7–4.0)
MCH: 26.9 pg (ref 26.0–34.0)
MCHC: 30.3 g/dL (ref 30.0–36.0)
MCV: 88.7 fL (ref 80.0–100.0)
Monocytes Absolute: 0.3 10*3/uL (ref 0.1–1.0)
Monocytes Relative: 2 %
Neutro Abs: 12.7 10*3/uL — ABNORMAL HIGH (ref 1.7–7.7)
Neutrophils Relative %: 91 %
Platelets: 287 10*3/uL (ref 150–400)
RBC: 4.53 MIL/uL (ref 3.87–5.11)
RDW: 16.5 % — ABNORMAL HIGH (ref 11.5–15.5)
WBC: 13.8 10*3/uL — ABNORMAL HIGH (ref 4.0–10.5)
nRBC: 0 % (ref 0.0–0.2)

## 2018-09-22 LAB — COMPREHENSIVE METABOLIC PANEL
ALT: 14 U/L (ref 0–44)
AST: 13 U/L — ABNORMAL LOW (ref 15–41)
Albumin: 3.2 g/dL — ABNORMAL LOW (ref 3.5–5.0)
Alkaline Phosphatase: 74 U/L (ref 38–126)
Anion gap: 11 (ref 5–15)
BUN: 42 mg/dL — ABNORMAL HIGH (ref 6–20)
CO2: 26 mmol/L (ref 22–32)
Calcium: 8.8 mg/dL — ABNORMAL LOW (ref 8.9–10.3)
Chloride: 101 mmol/L (ref 98–111)
Creatinine, Ser: 2.08 mg/dL — ABNORMAL HIGH (ref 0.44–1.00)
GFR calc Af Amer: 31 mL/min — ABNORMAL LOW (ref >60)
GFR calc non Af Amer: 27 mL/min — ABNORMAL LOW (ref >60)
Glucose, Bld: 130 mg/dL — ABNORMAL HIGH (ref 70–99)
Potassium: 4.5 mmol/L (ref 3.5–5.1)
Sodium: 138 mmol/L (ref 135–145)
Total Bilirubin: 0.4 mg/dL (ref 0.3–1.2)
Total Protein: 6.5 g/dL (ref 6.5–8.1)

## 2018-09-22 LAB — C-REACTIVE PROTEIN: CRP: 1.6 mg/dL — ABNORMAL HIGH (ref <1.0)

## 2018-09-22 MED ORDER — SODIUM CHLORIDE 0.9 % IV SOLN
200.0000 mg | Freq: Once | INTRAVENOUS | Status: AC
  Administered 2018-09-22: 200 mg via INTRAVENOUS
  Filled 2018-09-22: qty 40

## 2018-09-22 MED ORDER — SODIUM CHLORIDE 0.9 % IV SOLN
100.0000 mg | INTRAVENOUS | Status: AC
  Administered 2018-09-23 – 2018-09-26 (×4): 100 mg via INTRAVENOUS
  Filled 2018-09-22 (×4): qty 20

## 2018-09-22 NOTE — Progress Notes (Signed)
PROGRESS NOTE  Claudia Hoffman  PUL:[id number] DOB: Aug 03, 1965 DOA: 09/20/2018 PCP: Idelle Crouch, MD   Brief Narrative: Claudia Hoffman is a 53 y.o. 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 She had been seen by her PCP several times, initially diagnosed with ear and sinus infection put on steroids, augmentin, and inhaler for a week, later changing to levaquin and azithromycin for 5 days. In the ED she was hypoxic with CXR infiltrates bilaterally and found to have renal failure with creatinine to 2.77. She was admitted at [facility], started on steroids. Creatinine improved and remdesivir started 6/25.   Assessment & Plan: Principal Problem:   Pneumonia due to COVID-19 virus Active Problems:   Morbid obesity with BMI of 60.0-69.9, adult (HCC)   Obstructive sleep apnea of adult   Iron deficiency anemia due to dietary causes   GERD without esophagitis   Essential hypertension   Hypokalemia   Acute renal failure (ARF) (HCC)  Covid-19 infection: Felt to be at risk of progression to ARDS based on current assessment. Comorbidities including HTN, obesity, asthma. - Continue airborne, contact precautions. PPE including surgical gown, gloves, face shield, cap, shoe covers, and N-95 used during this encounter in a negative pressure room.  - Check daily labs: CBC w/diff, CMP, d-dimer, fibrinogen, ferritin, LDH, CRP - Continue steroids - Start remdesivir 6/25.  - PCT undetectable and pt has had multiple courses of abx and now diarrhea. Will stop abx..  - Stopped IVF's as pt is able to hydrate po and aiming to minimize volume expansion in covid.  - Avoid NSAIDs including pt's home lodine.  - Recommend proning and aggressive use of incentive spirometry. - PT/OT to minimize  deconditioning.    Acute renal failure: Creatinine 2.7 on admission up from 0.9 in April and prior. Due to dehydration, also possible AIN/nephrotoxicity of medications (abx, NSAIDs, and ARB) and possibly secondary to microthrombotic phenomenon from covid. No significant proteinuria on urinalysis.  - Monitor creatinine, I/O - Avoid nephrotoxins, hold ARB, HCTZ.  Morbid obesity: BMI is 70. Recent physical in April 2020 showed LDL 97, HbA1c 5.9% - Weight loss long term is recommended.  - Supplement vitamins  Hypokalemia: Resolved.  HTN:  - Hold medications with normotensive BPs  RA: Reported Dx, not yet established with rheumatology. ESR was 85.  - Avoid NSAIDs if possible  DVT prophylaxis: Will change to weight-based dosing of lovenox. D-dimer is wnl.  Code Status: Full Family Communication: Sister by phone this morning.  Disposition Plan: Home once improving, likely not for several more days.   Consultants:   None  Procedures:   None  Antimicrobials:  Ceftriaxone, azithromycin 6/24.    Remdesivir 6/25.   Subjective: Remains with significant shortness of breath with any exertion. Skin excoriations stable. Stools about 1-2 times per day, not watery, more formed. No abd pain.  Objective: Vitals:   09/22/18 0403 09/22/18 0854 09/22/18 0858 09/22/18 1229  BP: 134/77 117/63  108/66  Pulse: 94  92 89  Resp: (!) 22  19 (!) 21  Temp: 97.7 F (36.5 C) 98.1 F (36.7 C)  98.8 F (37.1 C)  TempSrc: Oral Oral  Oral  SpO2: 93%  93% 93%  Weight:      Height:  Intake/Output Summary (Last 24 hours) at 09/22/2018 1425 Last data filed at 09/22/2018 0830 Gross per 24 hour  Intake 1800 ml  Output 1450 ml  Net 350 ml   Filed Weights   09/20/18 2250  Weight: (!) 174 kg   Gen: Pleasant, obese female in no distress.  Pulm: Nonlabored at rest, tachypneic with supplemental oxygen hovering 91%. Clear, distant. CV: Regular rate and rhythm. No murmur, rub, or gallop. No JVD,  no dependent edema. GI: Abdomen soft, non-tender, non-distended, with normoactive bowel sounds.  Ext: Warm, no deformities Skin: No rashes, lesions or ulcers on visualized skin. Neuro: Alert and oriented. No focal neurological deficits. Psych: Judgement and insight appear fair. Mood euthymic & affect congruent. Behavior is appropriate.  Data Reviewed: I have personally reviewed following labs and imaging studies  CBC: Recent Labs  Lab 09/20/18 1402 09/21/18 0345 09/22/18 0500  WBC 11.0* 10.6* 13.8*  NEUTROABS 9.1* 9.7* 12.7*  HGB 13.6 12.9 12.2  HCT 41.8 41.6 40.2  MCV 85.0 88.1 88.7  PLT 288 261 211   Basic Metabolic Panel: Recent Labs  Lab 09/20/18 1402 09/21/18 0345 09/22/18 0500  NA 138 139 138  K 2.5* 3.1* 4.5  CL 97* 99 101  CO2 '27 26 26  ' GLUCOSE 107* 108* 130*  BUN 39* 43* 42*  CREATININE 2.77* 2.77* 2.08*  CALCIUM 8.6* 8.4* 8.8*  MG  --  2.4  --   PHOS  --  3.9  --    GFR: Estimated Creatinine Clearance: 49.2 mL/min (A) (by C-G formula based on SCr of 2.08 mg/dL (H)). Liver Function Tests: Recent Labs  Lab 09/20/18 1402 09/21/18 0345 09/22/18 0500  AST 19 17 13*  ALT '14 16 14  ' ALKPHOS 75 78 74  BILITOT 1.3* 0.9 0.4  PROT 7.6 7.3 6.5  ALBUMIN 3.5 3.4* 3.2*   No results for input(s): LIPASE, AMYLASE in the last 168 hours. No results for input(s): AMMONIA in the last 168 hours. Coagulation Profile: No results for input(s): INR, PROTIME in the last 168 hours. Cardiac Enzymes: Recent Labs  Lab 09/21/18 0345  CKTOTAL 165   BNP (last 3 results) No results for input(s): PROBNP in the last 8760 hours. HbA1C: No results for input(s): HGBA1C in the last 72 hours. CBG: No results for input(s): GLUCAP in the last 168 hours. Lipid Profile: Recent Labs    09/20/18 1618  TRIG 138   Thyroid Function Tests: No results for input(s): TSH, T4TOTAL, FREET4, T3FREE, THYROIDAB in the last 72 hours. Anemia Panel: Recent Labs    09/20/18 1402 09/21/18  0345  FERRITIN 132 117   Urine analysis:    Component Value Date/Time   COLORURINE YELLOW 09/21/2018 1445   APPEARANCEUR CLEAR 09/21/2018 1445   LABSPEC 1.011 09/21/2018 1445   PHURINE 5.0 09/21/2018 1445   GLUCOSEU NEGATIVE 09/21/2018 1445   HGBUR NEGATIVE 09/21/2018 1445   BILIRUBINUR NEGATIVE 09/21/2018 1445   [place] 09/21/2018 1445   PROTEINUR NEGATIVE 09/21/2018 1445   NITRITE NEGATIVE 09/21/2018 1445   LEUKOCYTESUR NEGATIVE 09/21/2018 1445   Recent Results (from the past 240 hour(s))  Blood Culture (routine x 2)     Status: None (Preliminary result)   Collection Time: 09/20/18  2:00 PM   Specimen: BLOOD  Result Value Ref Range Status   Specimen Description   Final    BLOOD LEFT ANTECUBITAL Blood Culture adequate volume   Special Requests NONE  Final   Culture   Final    NO GROWTH <  24 HOURS Performed at [facility], [address] [place], [place] [address]    Report Status PENDING  Incomplete  Blood Culture (routine x 2)     Status: None (Preliminary result)   Collection Time: 09/20/18  4:18 PM   Specimen: BLOOD  Result Value Ref Range Status   Specimen Description BLOOD RIGHT ANTECUBITAL  Final   Special Requests   Final    BOTTLES DRAWN AEROBIC AND ANAEROBIC Blood Culture adequate volume   Culture   Final    NO GROWTH < 24 HOURS Performed at [facility], [address], [place], [place] [address]    Report Status PENDING  Incomplete  Culture, sputum-assessment     Status: None   Collection Time: 09/21/18 12:18 AM   Specimen: Sputum  Result Value Ref Range Status   Specimen Description SPUTUM  Final   Special Requests NONE  Final   Sputum evaluation   Final    THIS SPECIMEN IS ACCEPTABLE FOR SPUTUM CULTURE Performed at [facility], [place] [address] [place], [place] [address]    Report Status 09/21/2018 FINAL  Final  Culture, respiratory     Status: None (Preliminary result)   Collection Time:  09/21/18 12:18 AM   Specimen: SPU  Result Value Ref Range Status   Specimen Description   Final    SPUTUM Performed at [facility] [address], [place], [place] [address]    Special Requests   Final    NONE Reflexed from [id number] Performed at [facility], [place] [address], [place], [place] [address]    Gram Stain   Final    RARE WBC PRESENT,BOTH PMN AND MONONUCLEAR RARE GRAM POSITIVE COCCI    Culture   Final    CULTURE REINCUBATED FOR BETTER GROWTH Performed at [facility], [place] [address], [place], [place] [address]    Report Status PENDING  Incomplete  MRSA PCR Screening     Status: None   Collection Time: 09/21/18 12:47 AM   Specimen: Nasal Mucosa; Nasopharyngeal  Result Value Ref Range Status   MRSA by PCR NEGATIVE NEGATIVE Final    Comment:        The GeneXpert MRSA Assay (FDA approved for NASAL specimens only), is one component of a comprehensive MRSA colonization surveillance program. It is not intended to diagnose MRSA infection nor to guide or monitor treatment for MRSA infections. Performed at [facility], [place] [address] [place], [place] [address]       Radiology Studies: Dg Chest Port 1 View  Result Date: 09/20/2018 CLINICAL DATA:  Worsening shortness of breath. COVID-19 positive. EXAM: PORTABLE CHEST 1 VIEW COMPARISON:  Report from 09/20/2018 chest radiographs (images not available). Chest CTA 03/20/2016. FINDINGS: The cardiac silhouette is mildly enlarged. Patchy airspace opacities are present in the mid and lower lungs bilaterally, also described on today's earlier study. Small pleural effusions are not excluded. No pneumothorax is identified. No acute osseous abnormality is seen. IMPRESSION: Bilateral airspace opacities consistent with pneumonia. Electronically Signed   By: Logan Bores M.D.   On: 09/20/2018 14:54    Scheduled Meds: . amitriptyline  50 mg Oral [id number]  . docusate sodium   200 mg Oral BID  . enoxaparin (LOVENOX) injection  85 mg Subcutaneous Q24H  . liver oil-zinc oxide   Topical Q12H  . mouth rinse  15 mL Mouth Rinse BID  . methylPREDNISolone (SOLU-MEDROL) injection  40 mg Intravenous Q12H  .  pneumococcal 23 valent vaccine  0.5 mL Intramuscular Tomorrow-1000   Continuous Infusions: . [START ON 09/23/2018] remdesivir 100 mg in NS 250 mL       LOS: 2 days   Time spent: 35 minutes.  Patrecia Pour, MD [facility] [email or web address] Password [email or web address] 09/22/2018, 2:25 PM

## 2018-09-22 NOTE — Progress Notes (Signed)
Physical Therapy Treatment Patient Details Name: Claudia Hoffman MRN: [id number] DOB: May 18, 1965 Today's Date: 09/22/2018    History of Present Illness 53 y.o. female with medical history significant of morbid obesity, sleep apnea not using CPAP, hypertension, chronic pain syndrome who is presenting to the emergency room with shortness of breath and hypoxia measured at primary care physician's office on follow-up. Tesed COIVD positive.    PT Comments    The patient resting at 955 on 2 L [place], ambulated x 60' o 2 L with sats dropping to 83%, increased to 4 L with sats at 88%. Returned to 2 liters after resting, continue PT,    Follow Up Recommendations  No PT follow up     Equipment Recommendations  None recommended by PT    Recommendations for Other Services       Precautions / Restrictions Precautions Precautions: Fall    Mobility  Bed Mobility               General bed mobility comments: oob  Transfers Overall transfer level: Needs assistance Equipment used: None Transfers: Sit to/from Stand Sit to Stand: Min guard            Ambulation/Gait Ambulation/Gait assistance: Land (Feet): 120 Feet             Stairs             Wheelchair Mobility    Modified Rankin (Stroke Patients Only)       Balance Overall balance assessment: Mild deficits observed, not formally tested                                          Cognition Arousal/Alertness: Awake/alert                                            Exercises      General Comments        Pertinent Vitals/Pain Faces Pain Scale: Hurts even more Pain Location: peri area with pressure Pain Descriptors / Indicators: Constant;Burning Pain Intervention(s): Monitored during session    Home Living                      Prior Function            PT Goals (current goals can now be found in the care plan section) Progress  towards PT goals: Progressing toward goals    Frequency    Min 3X/week      PT Plan Current plan remains appropriate    Co-evaluation              AM-PAC PT "6 Clicks" Mobility   Outcome Measure  Help needed turning from your back to your side while in a flat bed without using bedrails?: A Little Help needed moving from lying on your back to sitting on the side of a flat bed without using bedrails?: A Little Help needed moving to and from a bed to a chair (including a wheelchair)?: A Little Help needed standing up from a chair using your arms (e.g., wheelchair or bedside chair)?: A Little Help needed to walk in hospital room?: A Little Help needed climbing 3-5 steps with a railing? : A Little 6 Click Score: 18  End of Session Equipment Utilized During Treatment: Oxygen Activity Tolerance: Patient tolerated treatment well Patient left: in bed;with call bell/phone within reach Nurse Communication: Mobility status PT Visit Diagnosis: Difficulty in walking, not elsewhere classified (R26.2)     Time: 0601-5615 PT Time Calculation (min) (ACUTE ONLY): 44 min  Charges:  $Gait Training: 23-37 mins $Self Care/Home Management: 8-22                        Claretha Cooper 09/22/2018, 4:39 PM

## 2018-09-22 NOTE — Progress Notes (Signed)
Asked pt. If she would like me to call a family member to give updates on her condition. Pt. States she has been talking to family on the phone all day so she did not need me to call.

## 2018-09-23 LAB — COMPREHENSIVE METABOLIC PANEL
ALT: 14 U/L (ref 0–44)
AST: 11 U/L — ABNORMAL LOW (ref 15–41)
Albumin: 3.4 g/dL — ABNORMAL LOW (ref 3.5–5.0)
Alkaline Phosphatase: 72 U/L (ref 38–126)
Anion gap: 14 (ref 5–15)
BUN: 43 mg/dL — ABNORMAL HIGH (ref 6–20)
CO2: 24 mmol/L (ref 22–32)
Calcium: 9 mg/dL (ref 8.9–10.3)
Chloride: 103 mmol/L (ref 98–111)
Creatinine, Ser: 1.53 mg/dL — ABNORMAL HIGH (ref 0.44–1.00)
GFR calc Af Amer: 45 mL/min — ABNORMAL LOW (ref >60)
GFR calc non Af Amer: 38 mL/min — ABNORMAL LOW (ref >60)
Glucose, Bld: 113 mg/dL — ABNORMAL HIGH (ref 70–99)
Potassium: 4.2 mmol/L (ref 3.5–5.1)
Sodium: 141 mmol/L (ref 135–145)
Total Bilirubin: 0.3 mg/dL (ref 0.3–1.2)
Total Protein: 6.9 g/dL (ref 6.5–8.1)

## 2018-09-23 LAB — CULTURE, RESPIRATORY W GRAM STAIN: Culture: NORMAL

## 2018-09-23 LAB — CBC WITH DIFFERENTIAL/PLATELET
Abs Immature Granulocytes: 0.18 10*3/uL — ABNORMAL HIGH (ref 0.00–0.07)
Basophils Absolute: 0 10*3/uL (ref 0.0–0.1)
Basophils Relative: 0 %
Eosinophils Absolute: 0 10*3/uL (ref 0.0–0.5)
Eosinophils Relative: 0 %
HCT: 40 % (ref 36.0–46.0)
Hemoglobin: 12.1 g/dL (ref 12.0–15.0)
Immature Granulocytes: 2 %
Lymphocytes Relative: 5 %
Lymphs Abs: 0.6 10*3/uL — ABNORMAL LOW (ref 0.7–4.0)
MCH: 26.8 pg (ref 26.0–34.0)
MCHC: 30.3 g/dL (ref 30.0–36.0)
MCV: 88.5 fL (ref 80.0–100.0)
Monocytes Absolute: 0.3 10*3/uL (ref 0.1–1.0)
Monocytes Relative: 2 %
Neutro Abs: 10.5 10*3/uL — ABNORMAL HIGH (ref 1.7–7.7)
Neutrophils Relative %: 91 %
Platelets: 291 10*3/uL (ref 150–400)
RBC: 4.52 MIL/uL (ref 3.87–5.11)
RDW: 16.6 % — ABNORMAL HIGH (ref 11.5–15.5)
WBC: 11.6 10*3/uL — ABNORMAL HIGH (ref 4.0–10.5)
nRBC: 0 % (ref 0.0–0.2)

## 2018-09-23 LAB — LEGIONELLA PNEUMOPHILA SEROGP 1 UR AG: L. pneumophila Serogp 1 Ur Ag: NEGATIVE

## 2018-09-23 LAB — C-REACTIVE PROTEIN: CRP: <0.8 mg/dL (ref <1.0)

## 2018-09-23 MED ORDER — SALINE SPRAY 0.65 % NA SOLN
1.0000 | NASAL | Status: DC | PRN
  Filled 2018-09-23: qty 44

## 2018-09-23 MED ORDER — TRAZODONE HCL 50 MG PO TABS
50.0000 mg | ORAL_TABLET | Freq: Every evening | ORAL | Status: DC | PRN
  Administered 2018-09-23 – 2018-09-25 (×3): 50 mg via ORAL
  Filled 2018-09-23 (×3): qty 1

## 2018-09-23 NOTE — Progress Notes (Signed)
PROGRESS NOTE  Claudia Hoffman  TFT:[id number] DOB: [birth year] DOA: 09/20/2018 PCP: Idelle Crouch, MD   Brief Narrative: Claudia Hoffman is a 53 y.o. 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 She had been seen by her PCP several times, initially diagnosed with ear and sinus infection put on steroids, augmentin, and inhaler for a week, later changing to levaquin and azithromycin for 5 days. In the ED she was hypoxic with CXR infiltrates bilaterally and found to have renal failure with creatinine to 2.77. She was admitted at [facility], started on steroids. Creatinine improved and remdesivir started 6/25.   Assessment & Plan: Principal Problem:   Pneumonia due to COVID-19 virus Active Problems:   Morbid obesity with BMI of 60.0-69.9, adult (HCC)   Obstructive sleep apnea of adult   Iron deficiency anemia due to dietary causes   GERD without esophagitis   Essential hypertension   Hypokalemia   Acute renal failure (ARF) (HCC)  Covid-19 infection: Felt to be at risk of progression to ARDS based on current assessment. Comorbidities including HTN, obesity, asthma. - Continue airborne, contact precautions. PPE including surgical gown, gloves, face shield, cap, shoe covers, and N-95 used during this encounter in a negative pressure room.  - Check daily labs: CBC w/diff, CMP, d-dimer, fibrinogen, ferritin, LDH, CRP. CRP wnl today.  - Continue steroids - Started remdesivir 6/25.  - Avoid NSAIDs including pt's home lodine.  - Recommend proning and aggressive use of incentive spirometry. - PT/OT to minimize deconditioning.    Acute renal failure: Creatinine 2.7 on admission up from 0.9 in April and prior. Due to dehydration, also possible AIN/nephrotoxicity of medications (abx,  NSAIDs, and ARB) and possibly secondary to microthrombotic phenomenon from covid. No significant proteinuria on urinalysis.  - Continue improvement suggestive of prerenal etiology. Continue to push po fluids and monitor.  - Monitor creatinine, I/O - Avoid nephrotoxins, holding ARB, HCTZ.  Diarrhea: Resolving. Suspected to be antibiotic-associated.  - Monitor.  - Desitin topically to allow healing.   Morbid obesity: BMI is 70. Recent physical in April 2020 showed LDL 97, HbA1c 5.9% - Weight loss long term is recommended.  - Supplement vitamins - Fortunately not seeing steroid-induced hyperglycemia at this time.  Hypokalemia: Resolved.  HTN:  - Hold medications with normotensive BPs  RA: Reported Dx, not yet established with rheumatology. ESR was 85.  - Avoid NSAIDs if possible  DVT prophylaxis: Weight-based ppx dose lovenox. D-dimer is wnl.  Code Status: Full Family Communication: Sister by phone today Disposition Plan: Home once improving, possibly 6/29.  Consultants:   None  Procedures:   None  Antimicrobials:  Ceftriaxone, azithromycin 6/24.    Remdesivir 6/25 - 6/29.   Subjective: Breathing is about the same, short of breath with exertion is stable. Walked 60 ft yesterday with PT, felt good to get up but did desaturate requiring extra oxygen. No diarrhea yesterday. Urinating normally, still drinking plenty of fluid.   Objective: Vitals:   09/22/18 1600 09/22/18 1955 09/23/18 0404 09/23/18 0948  BP: 117/63 122/64 124/79   Pulse: 93 (!) 102 79   Resp: (!) 21     Temp: 99.1 F (37.3 C) 98.1 F (36.7 C) 98 F (36.7 C)   TempSrc: Oral Oral Oral   SpO2: 93% 92%  95% 93%  Weight:      Height:        Intake/Output Summary (Last 24 hours) at 09/23/2018 0950 Last data filed at 09/23/2018 0745 Gross per 24 hour  Intake 1345.12 ml  Output 1800 ml  Net -454.88 ml   Filed Weights   09/20/18 2250  Weight: (!) 174 kg   Gen: Pleasant, obese female in no distress  Pulm: Nonlabored breathing supplemental oxygen at rest. Clear anteriorly, no wheezing. CV: Regular rate and rhythm. No murmur, rub, or gallop. No JVD, no significant dependent edema. GI: Abdomen soft, non-tender, non-distended, with normoactive bowel sounds.  Ext: Warm, no deformities Skin: No new rashes, lesions or ulcers on visualized skin. VEnous stasis hyperpigmentation LLE > RLE stable. Neuro: Alert and oriented. No focal neurological deficits. Psych: Judgement and insight appear fair. Mood euthymic & affect congruent. Behavior is appropriate.   Data Reviewed: I have personally reviewed following labs and imaging studies  CBC: Recent Labs  Lab 09/20/18 1402 09/21/18 0345 09/22/18 0500 09/23/18 0300  WBC 11.0* 10.6* 13.8* 11.6*  NEUTROABS 9.1* 9.7* 12.7* 10.5*  HGB 13.6 12.9 12.2 12.1  HCT 41.8 41.6 40.2 40.0  MCV 85.0 88.1 88.7 88.5  PLT 288 261 287 585   Basic Metabolic Panel: Recent Labs  Lab 09/20/18 1402 09/21/18 0345 09/22/18 0500 09/23/18 0300  NA 138 139 138 141  K 2.5* 3.1* 4.5 4.2  CL 97* 99 101 103  CO2 _0 GLUCOSE 107* 108* 130* 113*  BUN 39* 43* 42* 43*  CREATININE 2.77* 2.77* 2.08* 1.53*  CALCIUM 8.6* 8.4* 8.8* 9.0  MG  --  2.4  --   --   PHOS  --  3.9  --   --    GFR: Estimated Creatinine Clearance: 66.9 mL/min (A) (by C-G formula based on SCr of 1.53 mg/dL (H)). Liver Function Tests: Recent Labs  Lab 09/20/18 1402 09/21/18 0345 09/22/18 0500 09/23/18 0300  AST 19 17 13* 11*  ALT _1 ALKPHOS 75 78 74 72  BILITOT 1.3* 0.9 0.4 0.3  PROT 7.6 7.3 6.5 6.9  ALBUMIN 3.5 3.4* 3.2* 3.4*   No results for input(s): LIPASE, AMYLASE in the last 168 hours. No results for input(s): AMMONIA in the last 168 hours. Coagulation Profile: No results for input(s): INR, PROTIME in the last 168 hours. Cardiac Enzymes: Recent Labs  Lab 09/21/18 0345  CKTOTAL 165   BNP (last 3 results) No results for input(s): PROBNP in the last 8760  hours. HbA1C: No results for input(s): HGBA1C in the last 72 hours. CBG: No results for input(s): GLUCAP in the last 168 hours. Lipid Profile: Recent Labs    09/20/18 1618  TRIG 138   Thyroid Function Tests: No results for input(s): TSH, T4TOTAL, FREET4, T3FREE, THYROIDAB in the last 72 hours. Anemia Panel: Recent Labs    09/20/18 1402 09/21/18 0345  FERRITIN 132 117   Urine analysis:    Component Value Date/Time   COLORURINE YELLOW 09/21/2018 1445   APPEARANCEUR CLEAR 09/21/2018 1445   LABSPEC 1.011 09/21/2018 1445   PHURINE 5.0 09/21/2018 1445   GLUCOSEU NEGATIVE 09/21/2018 1445   HGBUR NEGATIVE 09/21/2018 1445   BILIRUBINUR NEGATIVE 09/21/2018 1445   KETONESUR NEGATIVE 09/21/2018 1445   PROTEINUR NEGATIVE 09/21/2018 1445   NITRITE NEGATIVE 09/21/2018 1445   LEUKOCYTESUR NEGATIVE 09/21/2018 1445   Recent Results (from the past 240 hour(s))  Blood Culture (routine x 2)     Status:  None (Preliminary result)   Collection Time: 09/20/18  2:00 PM   Specimen: BLOOD  Result Value Ref Range Status   Specimen Description   Final    BLOOD LEFT ANTECUBITAL Blood Culture adequate volume   Special Requests NONE  Final   Culture   Final    NO GROWTH 3 DAYS Performed at [facility], [address] [place], [place] [address]    Report Status PENDING  Incomplete  Blood Culture (routine x 2)     Status: None (Preliminary result)   Collection Time: 09/20/18  4:18 PM   Specimen: BLOOD  Result Value Ref Range Status   Specimen Description BLOOD RIGHT ANTECUBITAL  Final   Special Requests   Final    BOTTLES DRAWN AEROBIC AND ANAEROBIC Blood Culture adequate volume   Culture   Final    NO GROWTH 3 DAYS Performed at [facility], [address], [place], [place] [address]    Report Status PENDING  Incomplete  Culture, sputum-assessment     Status: None   Collection Time: 09/21/18 12:18 AM   Specimen: Sputum  Result Value Ref Range Status   Specimen  Description SPUTUM  Final   Special Requests NONE  Final   Sputum evaluation   Final    THIS SPECIMEN IS ACCEPTABLE FOR SPUTUM CULTURE Performed at [facility], [place] [address] [place], [place] [address]    Report Status 09/21/2018 FINAL  Final  Culture, respiratory     Status: None   Collection Time: 09/21/18 12:18 AM   Specimen: SPU  Result Value Ref Range Status   Specimen Description   Final    SPUTUM Performed at [place] [address], [place], [place] [address]    Special Requests   Final    NONE Reflexed from [id number] Performed at [facility], [place] [address] [place], [place] [address]    Gram Stain   Final    RARE WBC PRESENT,BOTH PMN AND MONONUCLEAR RARE GRAM POSITIVE COCCI    Culture   Final    Consistent with normal respiratory flora. Performed at [facility], [place] [address] [place], [place] [address]    Report Status 09/23/2018 FINAL  Final  MRSA PCR Screening     Status: None   Collection Time: 09/21/18 12:47 AM   Specimen: Nasal Mucosa; Nasopharyngeal  Result Value Ref Range Status   MRSA by PCR NEGATIVE NEGATIVE Final    Comment:        The GeneXpert MRSA Assay (FDA approved for NASAL specimens only), is one component of a comprehensive MRSA colonization surveillance program. It is not intended to diagnose MRSA infection nor to guide or monitor treatment for MRSA infections. Performed at [facility], [place] [address], [place],  [address]       Radiology Studies: No results found.  Scheduled Meds: . amitriptyline  50 mg Oral [id number]  . docusate sodium  200 mg Oral BID  . enoxaparin (LOVENOX) injection  85 mg Subcutaneous Q24H  . liver oil-zinc oxide   Topical Q12H  . mouth rinse  15 mL Mouth Rinse BID  . methylPREDNISolone (SOLU-MEDROL) injection  40 mg Intravenous Q12H  . pneumococcal 23 valent vaccine  0.5 mL Intramuscular Tomorrow-1000    Continuous Infusions: . remdesivir 100 mg in NS 250 mL       LOS: 3 days   Time spent: 35 minutes.  Patrecia Pour, MD [facility]  [email or web address] Password [email or web address] 09/23/2018, 9:50 AM

## 2018-09-23 NOTE — Progress Notes (Addendum)
Occupational Therapy Treatment Patient Details Name: Claudia Hoffman MRN: [id number] DOB: 1965/12/13 Today's Date: 09/23/2018    History of present illness 53 y.o. female with medical history significant of morbid obesity, sleep apnea not using CPAP, hypertension, chronic pain syndrome who is presenting to the emergency room with shortness of breath and hypoxia measured at primary care physician's office on follow-up. Tesed COIVD positive.   OT comments  Pt upright in bed upon entry, reports has been attempting to take a nap due to not sleeping well last night but is agreeable to working with therapy. Pt engaging in bil UE/LE exercises this session - issued level 1 theraband and HEP with pt return demonstrating exercises given min cues. Pt fatigued with activity but appreciative of HEP provided. Pt on 1L O2 during session with lowest O2 sat 88% during seated exercise. Discussion also held re: option of using AE for toileting and LB ADL needs. Pt reports has modified AE for peri-care which she uses at home, will continue to assess need for AE during LB dressing tasks as pt currently reports feeling comfortable completing without. Pt reports has been mobilizing to bathroom for toileting needs with nursing staff, encouraged continuing to do so. Feel POC remains appropriate at this time. Will continue to follow acutely to progress pt's independence with ADL and mobility.    Follow Up Recommendations  No OT follow up;Supervision - Intermittent    Equipment Recommendations  None recommended by OT          Precautions / Restrictions Precautions Precautions: Fall Restrictions Weight Bearing Restrictions: No              ADL either performed or assessed with clinical judgement   ADL Overall ADL's : Needs assistance/impaired Eating/Feeding: Independent;Bed level                                     General ADL Comments: pt engaging in bil UE/LE exercise this session;  discussion held regarding potential need for AE during LB ADL or with peri-care. pt reports she typically uses LH sponge as modified toilet aide at home with ensuring cleanliness after BM and reports this has worked well for her. pt reports feeling comfortable performing LB ADL without AE but will benefit from practice of these tasks to ensure continued independence                       Cognition Arousal/Alertness: Awake/alert Behavior During Therapy: [facility] for tasks assessed/performed Overall Cognitive Status: Within Functional Limits for tasks assessed                                          Exercises Exercises: General Upper Extremity;General Lower Extremity General Exercises - Upper Extremity Shoulder Flexion: AROM;Both;10 reps;Theraband Theraband Level (Shoulder Flexion): Level 1 (Yellow) Shoulder Horizontal ABduction: AROM;Both;10 reps;Theraband Theraband Level (Shoulder Horizontal Abduction): Level 1 (Yellow) Shoulder Horizontal ADduction: AROM;Both;10 reps;Theraband Theraband Level (Shoulder Horizontal Adduction): Level 1 (Yellow) Elbow Flexion: AROM;Both;10 reps;Theraband Theraband Level (Elbow Flexion): Level 1 (Yellow) Elbow Extension: AROM;Both;10 reps;Theraband Theraband Level (Elbow Extension): Level 1 (Yellow) General Exercises - Lower Extremity Ankle Circles/Pumps: AROM;Both;5 reps Hip ABduction/ADduction: AROM;Both;10 reps;Supine Straight Leg Raises: AROM;Both;10 reps;Supine   Shoulder Instructions       General Comments      Pertinent  Vitals/ Pain       Pain Assessment: Faces Faces Pain Scale: Hurts little more Pain Location: peri area with pressure Pain Descriptors / Indicators: Constant;Burning Pain Intervention(s): Monitored during session  Home Living                                          Prior Functioning/Environment              Frequency  Min 3X/week        Progress Toward Goals  OT  Goals(current goals can now be found in the care plan section)  Progress towards OT goals: Progressing toward goals  Acute Rehab OT Goals Patient Stated Goal: Go home OT Goal Formulation: With patient Time For Goal Achievement: 10/05/18 Potential to Achieve Goals: Good ADL Goals Pt Will Perform Grooming: with modified independence;standing Pt Will Perform Lower Body Dressing: with modified independence;sit to/from stand Pt Will Transfer to Toilet: with modified independence;ambulating;regular height toilet Pt/caregiver will Perform Home Exercise Program: Increased ROM;Increased strength;With theraband;Independently;Both right and left upper extremity  Plan Discharge plan remains appropriate    Co-evaluation                 AM-PAC OT "6 Clicks" Daily Activity     Outcome Measure   Help from another person eating meals?: None Help from another person taking care of personal grooming?: A Little Help from another person toileting, which includes using toliet, bedpan, or urinal?: A Little Help from another person bathing (including washing, rinsing, drying)?: A Lot Help from another person to put on and taking off regular upper body clothing?: A Little Help from another person to put on and taking off regular lower body clothing?: A Lot 6 Click Score: 17    End of Session Equipment Utilized During Treatment: Oxygen  OT Visit Diagnosis: Unsteadiness on feet (R26.81);Other abnormalities of gait and mobility (R26.89);Muscle weakness (generalized) (M62.81)   Activity Tolerance Patient tolerated treatment well   Patient Left in bed;with call bell/phone within reach   Nurse Communication Mobility status        Time: 9163-8466 OT Time Calculation (min): 34 min  Charges: OT General Charges $OT Visit: 1 Visit OT Treatments $Self Care/Home Management : 8-22 mins $Therapeutic Activity: 8-22 mins  Lou Cal, OT [facility] Services Pager  [phone] Office [phone]    Raymondo Band 09/23/2018, 12:56 PM

## 2018-09-24 LAB — COMPREHENSIVE METABOLIC PANEL
ALT: 16 U/L (ref 0–44)
AST: 14 U/L — ABNORMAL LOW (ref 15–41)
Albumin: 3.4 g/dL — ABNORMAL LOW (ref 3.5–5.0)
Alkaline Phosphatase: 64 U/L (ref 38–126)
Anion gap: 12 (ref 5–15)
BUN: 42 mg/dL — ABNORMAL HIGH (ref 6–20)
CO2: 26 mmol/L (ref 22–32)
Calcium: 9.2 mg/dL (ref 8.9–10.3)
Chloride: 105 mmol/L (ref 98–111)
Creatinine, Ser: 1.12 mg/dL — ABNORMAL HIGH (ref 0.44–1.00)
GFR calc Af Amer: >60 mL/min (ref >60)
GFR calc non Af Amer: 56 mL/min — ABNORMAL LOW (ref >60)
Glucose, Bld: 135 mg/dL — ABNORMAL HIGH (ref 70–99)
Potassium: 4.7 mmol/L (ref 3.5–5.1)
Sodium: 143 mmol/L (ref 135–145)
Total Bilirubin: 0.1 mg/dL — ABNORMAL LOW (ref 0.3–1.2)
Total Protein: 6.9 g/dL (ref 6.5–8.1)

## 2018-09-24 LAB — CBC WITH DIFFERENTIAL/PLATELET
Abs Immature Granulocytes: 0.13 10*3/uL — ABNORMAL HIGH (ref 0.00–0.07)
Basophils Absolute: 0 10*3/uL (ref 0.0–0.1)
Basophils Relative: 0 %
Eosinophils Absolute: 0 10*3/uL (ref 0.0–0.5)
Eosinophils Relative: 0 %
HCT: 40.5 % (ref 36.0–46.0)
Hemoglobin: 12.2 g/dL (ref 12.0–15.0)
Immature Granulocytes: 1 %
Lymphocytes Relative: 7 %
Lymphs Abs: 0.7 10*3/uL (ref 0.7–4.0)
MCH: 26.9 pg (ref 26.0–34.0)
MCHC: 30.1 g/dL (ref 30.0–36.0)
MCV: 89.2 fL (ref 80.0–100.0)
Monocytes Absolute: 0.4 10*3/uL (ref 0.1–1.0)
Monocytes Relative: 4 %
Neutro Abs: 9.7 10*3/uL — ABNORMAL HIGH (ref 1.7–7.7)
Neutrophils Relative %: 88 %
Platelets: 282 10*3/uL (ref 150–400)
RBC: 4.54 MIL/uL (ref 3.87–5.11)
RDW: 16.6 % — ABNORMAL HIGH (ref 11.5–15.5)
WBC: 11 10*3/uL — ABNORMAL HIGH (ref 4.0–10.5)
nRBC: 0 % (ref 0.0–0.2)

## 2018-09-24 LAB — C-REACTIVE PROTEIN: CRP: <0.8 mg/dL (ref <1.0)

## 2018-09-24 NOTE — Plan of Care (Signed)
  Problem: Health Behavior/Discharge Planning: Goal: Ability to manage health-related needs will improve Outcome: Progressing   Problem: Clinical Measurements: Goal: Ability to maintain clinical measurements within normal limits will improve Outcome: Progressing Goal: Will remain free from infection Outcome: Progressing Goal: Diagnostic test results will improve Outcome: Progressing Goal: Respiratory complications will improve Outcome: Progressing Goal: Cardiovascular complication will be avoided Outcome: Progressing   Problem: Activity: Goal: Risk for activity intolerance will decrease Outcome: Progressing   Problem: Nutrition: Goal: Adequate nutrition will be maintained Outcome: Progressing   Problem: Coping: Goal: Level of anxiety will decrease Outcome: Progressing   Problem: Elimination: Goal: Will not experience complications related to bowel motility Outcome: Progressing   

## 2018-09-24 NOTE — Progress Notes (Signed)
ANTICOAGULATION CONSULT NOTE - Follow Up Consult  Pharmacy Consult for Lovenox Indication: VTE prophylaxis  Allergies  Allergen Reactions  . Other Other (See Comments)    LETTUCE, BEEF AND VANILLA CAUSE SNEEZING-DENIES ANY SWELLING OR SOB    Patient Measurements: Height: 5\' 2"  (157.5 cm) Weight: (!) 383 lb 9.6 oz (174 kg) IBW/kg (Calculated) : 50.1  Vital Signs: Temp: 97.8 F (36.6 C) (06/27 0400) BP: 126/83 (06/27 0400) Pulse Rate: 63 (06/27 0400)  Labs: Recent Labs    09/22/18 0500 09/23/18 0300 09/24/18 0433  HGB 12.2 12.1 12.2  HCT 40.2 40.0 40.5  PLT 287 291 282  CREATININE 2.08* 1.53*  --     Estimated Creatinine Clearance: 66.9 mL/min (A) (by C-G formula based on SCr of 1.53 mg/dL (H)).   Medical History: Past Medical History:  Diagnosis Date  . Allergy   . Anemia   . Arthritis    osteo-knees, shoulders  . Asthma    well controlled  . Atopic dermatitis   . Family history of adverse reaction to anesthesia    mom-nauseated after surgery  . Headache    migraines-1-2 per year  . Hypertension   . Obesity, Class III, BMI 40-49.9 (morbid obesity) ([place]) 11/2012   s/p bariatric sleeve surgery by Dr. Darnell Level   . Sleep apnea    no cpap    Assessment: 32 YOF who is COVID positive on weight based Lovenox dose for VTE prophylaxis. H/H and Plt wnl. SCr has improved significantly   Plan:  -Continue Lovenox 0.5 mg/kg (85 mg) daily per COVID protocol -Monitor SCr and CBC  Claudia Hoffman, PharmD., BCPS Clinical Pharmacist Clinical phone for 09/24/18 until 5pm: [phone]

## 2018-09-24 NOTE — Progress Notes (Signed)
Physical Therapy Treatment Patient Details Name: Claudia Hoffman MRN: [id number] DOB: [birth year] Today's Date: 09/24/2018    History of Present Illness 53 y.o. female with medical history significant of morbid obesity, sleep apnea not using CPAP, hypertension, chronic pain syndrome who is presenting to the emergency room with shortness of breath and hypoxia measured at primary care physician's office on follow-up. Tesed COIVD positive.    PT Comments    The patient ambulated x 130' on 2 then 3 L. Noted SaO2 dropping to 84%. Increased to 3 L for >88% while ambulating. Gait is slow with 2 rest breaks standin g. Encouraged pursed lip breaths. Encouraged pt. To [facility] nursing  For additional opportunities to ambulate inI hall.  Follow Up Recommendations  No PT follow up     Equipment Recommendations  None recommended by PT    Recommendations for Other Services       Precautions / Restrictions Precautions Precautions: Fall Precaution Comments: monitor sats    Mobility  Bed Mobility   Bed Mobility: Supine to Sit     Supine to sit: Min assist     General bed mobility comments: assist with trunk at eand of sitting, bed rails are in the way of legs getting over edge  Transfers Overall transfer level: Needs assistance Equipment used: None   Sit to Stand: Supervision            Ambulation/Gait Ambulation/Gait assistance: Min guard Gait Distance (Feet): 130 Feet Assistive device: None Gait Pattern/deviations: Step-to pattern;Step-through pattern Gait velocity: der   General Gait Details: lateral weight shifting to step. Pt. on 2 L with SaO2 dropping  to 85%, increased to 3 L for >88% remainder of walk   Stairs             Wheelchair Mobility    Modified Rankin (Stroke Patients Only)       Balance                                            Cognition Arousal/Alertness: Awake/alert                                             Exercises      General Comments        Pertinent Vitals/Pain Faces Pain Scale: Hurts little more Pain Location: peri area with pressure Pain Descriptors / Indicators: Constant;Burning Pain Intervention(s): Monitored during session    Home Living                      Prior Function            PT Goals (current goals can now be found in the care plan section) Progress towards PT goals: Progressing toward goals    Frequency    Min 3X/week      PT Plan Current plan remains appropriate    Co-evaluation              AM-PAC PT "6 Clicks" Mobility   Outcome Measure  Help needed turning from your back to your side while in a flat bed without using bedrails?: A Little Help needed moving from lying on your back to sitting on the side of a flat bed without using bedrails?: A Little  Help needed moving to and from a bed to a chair (including a wheelchair)?: A Little Help needed standing up from a chair using your arms (e.g., wheelchair or bedside chair)?: A Little Help needed to walk in hospital room?: A Little Help needed climbing 3-5 steps with a railing? : A Little 6 Click Score: 18    End of Session Equipment Utilized During Treatment: Oxygen Activity Tolerance: Patient limited by fatigue;Treatment limited secondary to medical complications (Comment) Patient left: (in BE waiting on assistance) Nurse Communication: Mobility status PT Visit Diagnosis: Difficulty in walking, not elsewhere classified (R26.2)     Time: 8875-7972 PT Time Calculation (min) (ACUTE ONLY): 26 min  Charges:  $Gait Training: 23-37 mins                     Blanchard Kelch PT Acute Rehabilitation Services Pager [phone] Office [phone]    Rada Hay 09/24/2018, 12:36 PM

## 2018-09-24 NOTE — Progress Notes (Signed)
PROGRESS NOTE  Claudia Hoffman  DSK:[id number] DOB: [birth year] DOA: 09/20/2018 PCP: Idelle Crouch, MD   Brief Narrative: Claudia Hoffman is a 53 y.o. 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 She had been seen by her PCP several times, initially diagnosed with ear and sinus infection put on steroids, augmentin, and inhaler for a week, later changing to levaquin and azithromycin for 5 days. In the ED she was hypoxic with CXR infiltrates bilaterally and found to have renal failure with creatinine to 2.77. She was admitted at [facility], started on steroids. Creatinine improved and remdesivir started 6/25.   Assessment & Plan: Principal Problem:   Pneumonia due to COVID-19 virus Active Problems:   Morbid obesity with BMI of 60.0-69.9, adult (HCC)   Obstructive sleep apnea of adult   Iron deficiency anemia due to dietary causes   GERD without esophagitis   Essential hypertension   Hypokalemia   Acute renal failure (ARF) (HCC)  Covid-19 infection: Felt to be at risk of progression to ARDS based on current assessment. Comorbidities including HTN, obesity, asthma. - Continue airborne, contact precautions. PPE including surgical gown, gloves, face shield, cap, shoe covers, and N-95 used during this encounter in a negative pressure room.  - Check daily labs: CBC w/diff, CMP, d-dimer, fibrinogen, ferritin, LDH, CRP. CRP wnl, will stop trending inflammatory markers. - Continue steroids - Started remdesivir 6/25. Will complete 6/29. - Avoid NSAIDs including pt's home lodine.  - Recommend proning and aggressive use of incentive spirometry. - PT/OT to minimize deconditioning.    Acute renal failure: Creatinine 2.7 on admission up from 0.9 in April and prior. Due to dehydration,  also possible AIN/nephrotoxicity of medications (abx, NSAIDs, and ARB) and possibly secondary to microthrombotic phenomenon from covid. No significant proteinuria on urinalysis.  - Continue improvement suggestive of prerenal etiology. CrCl now above 63m/min - Monitor creatinine, I/O - Avoid nephrotoxins, holding ARB, HCTZ.  Diarrhea: Resolved. Suspected to be antibiotic-associated.  - Monitor.  - Desitin topically to allow healing.   Morbid obesity: BMI is 70. Recent physical in April 2020 showed LDL 97, HbA1c 5.9% - Weight loss long term is recommended.  - Supplement vitamins - Fortunately not seeing steroid-induced hyperglycemia at this time.  Hypokalemia: Resolved.  HTN:  - Hold medications with normotensive BPs  RA: Reported Dx, not yet established with rheumatology. ESR was 85.  - Avoid NSAIDs if possible  DVT prophylaxis: Weight-based ppx dose lovenox. D-dimer is wnl.  Code Status: Full Family Communication: Sister by phone today Disposition Plan: Home once improving, possibly 6/29-6/30.  Consultants:   None  Procedures:   None  Antimicrobials:  Ceftriaxone, azithromycin 6/24.    Remdesivir 6/25 - 6/29.   Subjective: Diarrhea has resolved, normal BM this morning. No fevers, minimal cough, dyspnea is stable, moderate, worse with exertion. Normal urine output  Objective: Vitals:   09/23/18 1800 09/23/18 2015 09/24/18 0400 09/24/18 0800  BP:  118/67 126/83   Pulse:  80 63 65  Resp:  18 18   Temp:  98.2 F (36.8 C) 97.8 F (36.6 C) 98.3 F (36.8 C)  TempSrc:    Oral  SpO2: 91% 93% 94% 91%  Weight:      Height:        Intake/Output Summary (Last  24 hours) at 09/24/2018 1419 Last data filed at 09/24/2018 1139 Gross per 24 hour  Intake 1260 ml  Output 1700 ml  Net -440 ml   Filed Weights   09/20/18 2250  Weight: (!) 174 kg   Gen: Pleasant and obese female in no distress Pulm: Nonlabored breathing supplemental oxygen at rest. Clear. CV: Regular  rate and rhythm. No murmur, rub, or gallop. No JVD, no dependent edema. GI: Abdomen soft, non-tender, non-distended, with normoactive bowel sounds.  Ext: Warm, no deformities Skin: No new rashes, lesions or ulcers on limited visualized skin. Neuro: Alert and oriented. No focal neurological deficits. Psych: Judgement and insight appear fair. Mood euthymic & affect congruent. Behavior is appropriate.    Data Reviewed: I have personally reviewed following labs and imaging studies  CBC: Recent Labs  Lab 09/20/18 1402 09/21/18 0345 09/22/18 0500 09/23/18 0300 09/24/18 0433  WBC 11.0* 10.6* 13.8* 11.6* 11.0*  NEUTROABS 9.1* 9.7* 12.7* 10.5* 9.7*  HGB 13.6 12.9 12.2 12.1 12.2  HCT 41.8 41.6 40.2 40.0 40.5  MCV 85.0 88.1 88.7 88.5 89.2  PLT 288 261 287 291 803   Basic Metabolic Panel: Recent Labs  Lab 09/20/18 1402 09/21/18 0345 09/22/18 0500 09/23/18 0300 09/24/18 0433  NA 138 139 138 141 143  K 2.5* 3.1* 4.5 4.2 4.7  CL 97* 99 101 103 105  CO2 '27 26 26 24 26  ' GLUCOSE 107* 108* 130* 113* 135*  BUN 39* 43* 42* 43* 42*  CREATININE 2.77* 2.77* 2.08* 1.53* 1.12*  CALCIUM 8.6* 8.4* 8.8* 9.0 9.2  MG  --  2.4  --   --   --   PHOS  --  3.9  --   --   --    GFR: Estimated Creatinine Clearance: 91.4 mL/min (A) (by C-G formula based on SCr of 1.12 mg/dL (H)). Liver Function Tests: Recent Labs  Lab 09/20/18 1402 09/21/18 0345 09/22/18 0500 09/23/18 0300 09/24/18 0433  AST 19 17 13* 11* 14*  ALT '14 16 14 14 16  ' ALKPHOS 75 78 74 72 64  BILITOT 1.3* 0.9 0.4 0.3 0.1*  PROT 7.6 7.3 6.5 6.9 6.9  ALBUMIN 3.5 3.4* 3.2* 3.4* 3.4*   No results for input(s): LIPASE, AMYLASE in the last 168 hours. No results for input(s): AMMONIA in the last 168 hours. Coagulation Profile: No results for input(s): INR, PROTIME in the last 168 hours. Cardiac Enzymes: Recent Labs  Lab 09/21/18 0345  CKTOTAL 165   BNP (last 3 results) No results for input(s): PROBNP in the last 8760 hours.  HbA1C: No results for input(s): HGBA1C in the last 72 hours. CBG: No results for input(s): GLUCAP in the last 168 hours. Lipid Profile: No results for input(s): CHOL, HDL, LDLCALC, TRIG, CHOLHDL, LDLDIRECT in the last 72 hours. Thyroid Function Tests: No results for input(s): TSH, T4TOTAL, FREET4, T3FREE, THYROIDAB in the last 72 hours. Anemia Panel: No results for input(s): VITAMINB12, FOLATE, FERRITIN, TIBC, IRON, RETICCTPCT in the last 72 hours. Urine analysis:    Component Value Date/Time   COLORURINE YELLOW 09/21/2018 1445   APPEARANCEUR CLEAR 09/21/2018 1445   LABSPEC 1.011 09/21/2018 1445   PHURINE 5.0 09/21/2018 1445   GLUCOSEU NEGATIVE 09/21/2018 1445   HGBUR NEGATIVE 09/21/2018 [place] 09/21/2018 1445   KETONESUR NEGATIVE 09/21/2018 1445   PROTEINUR NEGATIVE 09/21/2018 1445   NITRITE NEGATIVE 09/21/2018 1445   LEUKOCYTESUR NEGATIVE 09/21/2018 1445   Recent Results (from the past 240 hour(s))  Blood Culture (routine  x 2)     Status: None (Preliminary result)   Collection Time: 09/20/18  2:00 PM   Specimen: BLOOD  Result Value Ref Range Status   Specimen Description   Final    BLOOD LEFT ANTECUBITAL Blood Culture adequate volume   Special Requests NONE  Final   Culture   Final    NO GROWTH 4 DAYS Performed at [facility], [address], [place], [place] [address]    Report Status PENDING  Incomplete  Blood Culture (routine x 2)     Status: None (Preliminary result)   Collection Time: 09/20/18  4:18 PM   Specimen: BLOOD  Result Value Ref Range Status   Specimen Description BLOOD RIGHT ANTECUBITAL  Final   Special Requests   Final    BOTTLES DRAWN AEROBIC AND ANAEROBIC Blood Culture adequate volume   Culture   Final    NO GROWTH 4 DAYS Performed at [facility], [address], [place], [place] [address]    Report Status PENDING  Incomplete  Culture, sputum-assessment     Status: None   Collection Time: 09/21/18 12:18  AM   Specimen: Sputum  Result Value Ref Range Status   Specimen Description SPUTUM  Final   Special Requests NONE  Final   Sputum evaluation   Final    THIS SPECIMEN IS ACCEPTABLE FOR SPUTUM CULTURE Performed at [facility], [place] [address], [place], [place] [address]    Report Status 09/21/2018 FINAL  Final  Culture, respiratory     Status: None   Collection Time: 09/21/18 12:18 AM   Specimen: SPU  Result Value Ref Range Status   Specimen Description   Final    SPUTUM Performed at [place] [address] [place], [place] [address]    Special Requests   Final    NONE Reflexed from [id number] Performed at [facility], [place] [address] [place], [place] [address]    Gram Stain   Final    RARE WBC PRESENT,BOTH PMN AND MONONUCLEAR RARE GRAM POSITIVE COCCI    Culture   Final    Consistent with normal respiratory flora. Performed at [facility], [place] [address] [place], [place] [address]    Report Status 09/23/2018 FINAL  Final  MRSA PCR Screening     Status: None   Collection Time: 09/21/18 12:47 AM   Specimen: Nasal Mucosa; Nasopharyngeal  Result Value Ref Range Status   MRSA by PCR NEGATIVE NEGATIVE Final    Comment:        The GeneXpert MRSA Assay (FDA approved for NASAL specimens only), is one component of a comprehensive MRSA colonization surveillance program. It is not intended to diagnose MRSA infection nor to guide or monitor treatment for MRSA infections. Performed at [facility], [place] [address], [place],  [address]       Radiology Studies: No results found.  Scheduled Meds: . amitriptyline  50 mg Oral [id number]  . docusate sodium  200 mg Oral BID  . enoxaparin (LOVENOX) injection  85 mg Subcutaneous Q24H  . liver oil-zinc oxide   Topical Q12H  . mouth rinse  15 mL Mouth Rinse BID  . methylPREDNISolone (SOLU-MEDROL) injection  40 mg Intravenous Q12H  . pneumococcal  23 valent vaccine  0.5 mL Intramuscular Tomorrow-1000   Continuous Infusions: . remdesivir 100 mg in NS 250 mL 100 mg (09/24/18 1139)     LOS: 4 days   Time spent:  35 minutes.  Patrecia Pour, MD [facility] [email or web address] Password [facility] 09/24/2018, 2:19 PM

## 2018-09-25 LAB — COMPREHENSIVE METABOLIC PANEL
ALT: 19 U/L (ref 0–44)
AST: 17 U/L (ref 15–41)
Albumin: 3.2 g/dL — ABNORMAL LOW (ref 3.5–5.0)
Alkaline Phosphatase: 62 U/L (ref 38–126)
Anion gap: 7 (ref 5–15)
BUN: 40 mg/dL — ABNORMAL HIGH (ref 6–20)
CO2: 27 mmol/L (ref 22–32)
Calcium: 8.7 mg/dL — ABNORMAL LOW (ref 8.9–10.3)
Chloride: 107 mmol/L (ref 98–111)
Creatinine, Ser: 0.99 mg/dL (ref 0.44–1.00)
GFR calc Af Amer: >60 mL/min (ref >60)
GFR calc non Af Amer: >60 mL/min (ref >60)
Glucose, Bld: 138 mg/dL — ABNORMAL HIGH (ref 70–99)
Potassium: 4.9 mmol/L (ref 3.5–5.1)
Sodium: 141 mmol/L (ref 135–145)
Total Bilirubin: 0.3 mg/dL (ref 0.3–1.2)
Total Protein: 6.6 g/dL (ref 6.5–8.1)

## 2018-09-25 LAB — CBC WITH DIFFERENTIAL/PLATELET
Abs Immature Granulocytes: 0.19 10*3/uL — ABNORMAL HIGH (ref 0.00–0.07)
Basophils Absolute: 0 10*3/uL (ref 0.0–0.1)
Basophils Relative: 0 %
Eosinophils Absolute: 0 10*3/uL (ref 0.0–0.5)
Eosinophils Relative: 0 %
HCT: 38.8 % (ref 36.0–46.0)
Hemoglobin: 11.7 g/dL — ABNORMAL LOW (ref 12.0–15.0)
Immature Granulocytes: 2 %
Lymphocytes Relative: 7 %
Lymphs Abs: 0.8 10*3/uL (ref 0.7–4.0)
MCH: 26.8 pg (ref 26.0–34.0)
MCHC: 30.2 g/dL (ref 30.0–36.0)
MCV: 88.8 fL (ref 80.0–100.0)
Monocytes Absolute: 0.4 10*3/uL (ref 0.1–1.0)
Monocytes Relative: 3 %
Neutro Abs: 9.9 10*3/uL — ABNORMAL HIGH (ref 1.7–7.7)
Neutrophils Relative %: 88 %
Platelets: 267 10*3/uL (ref 150–400)
RBC: 4.37 MIL/uL (ref 3.87–5.11)
RDW: 16.6 % — ABNORMAL HIGH (ref 11.5–15.5)
WBC: 11.2 10*3/uL — ABNORMAL HIGH (ref 4.0–10.5)
nRBC: 0 % (ref 0.0–0.2)

## 2018-09-25 LAB — CULTURE, BLOOD (ROUTINE X 2)
Culture: NO GROWTH
Culture: NO GROWTH
Special Requests: ADEQUATE
Specimen Description: ADEQUATE

## 2018-09-25 NOTE — Progress Notes (Signed)
Pt is requesting any information for financial assistance, if there is any for pt with covid that are out of work. Thank you

## 2018-09-25 NOTE — Progress Notes (Addendum)
SATURATION QUALIFICATIONS: (This note is used to comply with regulatory documentation for home oxygen)  Patient Saturations on Room Air at Rest = 96%  Patient Saturations on 2 Liters while Ambulating = 84%  Patient Saturations on 3 Liters of oxygen while Ambulating = 94-88%  Please briefly explain why patient needs home oxygen: Pt requiring 2-3 L of O2 for performance of ADLs and functional mobility.   [place], OTR/L Acute Rehab Pager: [phone] Office: [phone]

## 2018-09-25 NOTE — Progress Notes (Signed)
Occupational Therapy Treatment Patient Details Name: Claudia Hoffman MRN: [id number] DOB: 1965-04-03 Today's Date: 09/25/2018    History of present illness 53 y.o. female with medical history significant of morbid obesity, sleep apnea not using CPAP, hypertension, chronic pain syndrome who is presenting to the emergency room with shortness of breath and hypoxia measured at primary care physician's office on follow-up. Tesed COIVD positive.   OT comments  Pt progressing towards established OT goals and motivated to participate in therapy. At rest, pt Spo2 >90% on RA. Pt performing toilet transfer at supervision level. Discussing ways to perform toilet hygiene and providing toilet aide; pt continues to require Min A to make sure peri area clean. Pt's Spo2 dropping to 78% on 2L O2 during toileting and she required seated rest break to return to 90s. Pt performing functional mobility in hallway with supervision and SpO2 dropping to 88% on 2L; pt requiring several standing rest breaks for fatigue and purse lip breathing. Discussing use of tub bench for bathing at home; pt verbalized understanding. Continue to recommend dc home once medically stable and will continue to follow acutely as admitted.    Follow Up Recommendations  No OT follow up;Supervision - Intermittent    Equipment Recommendations  None recommended by OT    Recommendations for Other Services PT consult    Precautions / Restrictions Precautions Precautions: Fall Precaution Comments: monitor sats Restrictions Weight Bearing Restrictions: No       Mobility Bed Mobility Overal bed mobility: Needs Assistance Bed Mobility: Supine to Sit;Sit to Supine     Supine to sit: Supervision Sit to supine: Supervision   General bed mobility comments: supervision for safety  Transfers Overall transfer level: Needs assistance Equipment used: None Transfers: Sit to/from Stand Sit to Stand: Supervision         General transfer  comment: Supervision for safety    Balance Overall balance assessment: Mild deficits observed, not formally tested                                         ADL either performed or assessed with clinical judgement   ADL Overall ADL's : Needs assistance/impaired     Grooming: Oral care;Brushing hair;Min guard;Standing                   Toilet Transfer: Supervision/safety;Ambulation;Regular Toilet;Grab bars   Toileting- Clothing Manipulation and Hygiene: Minimal assistance;Sit to/from stand;With adaptive equipment Toileting - Clothing Manipulation Details (indicate cue type and reason): Discussing techniques for toilet hygiene. Pt attempting to perform toileting as she would at home but fatigues quickly. Educating on AE for toileting. Issued toilet aide. Min A required to make sure peri area cleaned.   Tub/Shower Transfer Details (indicate cue type and reason): Discussing fatigue with ADLs and need for tub bench. Pt verablizing understanding.  Functional mobility during ADLs: Supervision/safety General ADL Comments: Focused session on toileting, tub transfers, and mobility in hallway. Pt maintaining Spo2 >88% on 2L during mobility in hallway. SpO2 dropping to 78% on 2L during toileting and required seated rest break to return to 90s     Vision       Perception     Praxis      Cognition Arousal/Alertness: Awake/alert Behavior During Therapy: [facility] for tasks assessed/performed Overall Cognitive Status: Within Functional Limits for tasks assessed  Exercises     Shoulder Instructions       General Comments SpO2 98-91% on RA at rest. dropping to 78% on 2L during toileting and down to 88% during mobility on 2L    Pertinent Vitals/ Pain       Pain Assessment: Faces Faces Pain Scale: Hurts little more Pain Location: peri area with pressure Pain Descriptors / Indicators: Constant;Burning Pain  Intervention(s): Monitored during session;Limited activity within patient's tolerance;Repositioned  Home Living                                          Prior Functioning/Environment              Frequency  Min 3X/week        Progress Toward Goals  OT Goals(current goals can now be found in the care plan section)  Progress towards OT goals: Progressing toward goals  Acute Rehab OT Goals Patient Stated Goal: Go home OT Goal Formulation: With patient Time For Goal Achievement: 10/05/18 Potential to Achieve Goals: Good ADL Goals Pt Will Perform Grooming: with modified independence;standing Pt Will Perform Lower Body Dressing: with modified independence;sit to/from stand Pt Will Transfer to Toilet: with modified independence;ambulating;regular height toilet Pt/caregiver will Perform Home Exercise Program: Increased ROM;Increased strength;With theraband;Independently;Both right and left upper extremity  Plan Discharge plan remains appropriate    Co-evaluation                 AM-PAC OT "6 Clicks" Daily Activity     Outcome Measure   Help from another person eating meals?: None Help from another person taking care of personal grooming?: A Little Help from another person toileting, which includes using toliet, bedpan, or urinal?: A Little Help from another person bathing (including washing, rinsing, drying)?: A Lot Help from another person to put on and taking off regular upper body clothing?: A Little Help from another person to put on and taking off regular lower body clothing?: A Lot 6 Click Score: 17    End of Session Equipment Utilized During Treatment: Oxygen(2L)  OT Visit Diagnosis: Unsteadiness on feet (R26.81);Other abnormalities of gait and mobility (R26.89);Muscle weakness (generalized) (M62.81)   Activity Tolerance Patient tolerated treatment well   Patient Left in bed;with call bell/phone within reach   Nurse Communication  Mobility status        Time: 0926-1020 OT Time Calculation (min): 54 min  Charges: OT General Charges $OT Visit: 1 Visit OT Treatments $Self Care/Home Management : 38-52 mins $Therapeutic Activity: 8-22 mins  [place], OTR/L Acute Rehab Pager: [phone] Office: [place] 09/25/2018, 12:25 PM

## 2018-09-25 NOTE — Plan of Care (Signed)
  Problem: Health Behavior/Discharge Planning: Goal: Ability to manage health-related needs will improve Outcome: Progressing   Problem: Clinical Measurements: Goal: Ability to maintain clinical measurements within normal limits will improve Outcome: Progressing Goal: Will remain free from infection Outcome: Progressing Goal: Diagnostic test results will improve Outcome: Progressing Goal: Respiratory complications will improve Outcome: Progressing   Problem: Activity: Goal: Risk for activity intolerance will decrease Outcome: Progressing   Problem: Coping: Goal: Level of anxiety will decrease Outcome: Progressing   Problem: Pain Managment: Goal: General experience of comfort will improve Outcome: Progressing   Problem: Safety: Goal: Ability to remain free from injury will improve Outcome: Progressing   Problem: Skin Integrity: Goal: Risk for impaired skin integrity will decrease Outcome: Progressing   Problem: Clinical Measurements: Goal: Cardiovascular complication will be avoided Outcome: Completed/Met   Problem: Nutrition: Goal: Adequate nutrition will be maintained Outcome: Completed/Met   Problem: Elimination: Goal: Will not experience complications related to bowel motility Outcome: Completed/Met Goal: Will not experience complications related to urinary retention Outcome: Completed/Met

## 2018-09-25 NOTE — Progress Notes (Signed)
PROGRESS NOTE  Claudia Hoffman  ZOX:[id number] DOB: 10/27/1965 DOA: 09/20/2018 PCP: Idelle Crouch, MD   Brief Narrative: Claudia Hoffman is a 53 y.o. 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 She had been seen by her PCP several times, initially diagnosed with ear and sinus infection put on steroids, augmentin, and inhaler for a week, later changing to levaquin and azithromycin for 5 days. In the ED she was hypoxic with CXR infiltrates bilaterally and found to have renal failure with creatinine to 2.77. She was admitted at [facility], started on steroids. Creatinine improved and remdesivir started 6/25.   Assessment & Plan: Principal Problem:   Pneumonia due to COVID-19 virus Active Problems:   Morbid obesity with BMI of 60.0-69.9, adult (HCC)   Obstructive sleep apnea of adult   Iron deficiency anemia due to dietary causes   GERD without esophagitis   Essential hypertension   Hypokalemia   Acute renal failure (ARF) (HCC)  Covid-19 infection: Felt to be at risk of progression to ARDS based on current assessment. Comorbidities including HTN, obesity, asthma. - Continue airborne, contact precautions. PPE including surgical gown, gloves, face shield, cap, shoe covers, and N-95 used during this encounter in a negative pressure room.  - Check daily labs: CBC w/diff, CMP, d-dimer, fibrinogen, ferritin, LDH, CRP. CRP wnl, will stop trending inflammatory markers. - Continue steroids at current dose, remains hypoxic and not having severe side effects at this time. - Started remdesivir 6/25. Will complete 6/29. - Avoid NSAIDs including pt's home lodine.  - Recommend proning and aggressive use of incentive spirometry. - PT/OT continues to minimize deconditioning.    Acute renal  failure: Creatinine 2.7 on admission up from 0.9 in April and prior. Resolved. Due to dehydration, also possible AIN/nephrotoxicity of medications (abx, NSAIDs, and ARB) and possibly secondary to microthrombotic phenomenon from covid. No significant proteinuria on urinalysis.  - Avoid nephrotoxins, holding ARB, HCTZ.  Diarrhea: Resolved. Suspected to be antibiotic-associated.  - Monitor.  - Desitin topically to allow healing.   Morbid obesity: BMI is 70. Recent physical in April 2020 showed LDL 97, HbA1c 5.9% - Weight loss long term is recommended.  - Supplement vitamins - Fortunately not seeing steroid-induced hyperglycemia at this time.  Hypokalemia: Resolved.  HTN:  - Hold medications for now  RA: Reported Dx, not yet established with rheumatology. ESR was 85.  - Avoid NSAIDs if possible  DVT prophylaxis: Weight-based ppx dose lovenox. D-dimer is wnl.  Code Status: Full Family Communication: Sister by phone today Disposition Plan: Home once stable, anticipate 6/29  Consultants:   None  Procedures:   None  Antimicrobials:  Ceftriaxone, azithromycin 6/24.    Remdesivir 6/25 - 6/29.   Subjective: "I'm fine." Breathing at rest is without dyspnea, no chest pain. When up ot bathroom does get severely short of breath, but willing to get up with therapy today. No more diarrhea or other complaints.   Objective: Vitals:   09/24/18 1613 09/24/18 2029 09/25/18 0500 09/25/18 0748  BP: 129/75 (!) 154/75 131/77 123/81  Pulse: 87 92 64 (!) 54  Resp:  18 20   Temp: 98.5 F (36.9 C) 97.6 F (36.4 C) 97.8 F (36.6 C) 98.4 F (36.9 C)  TempSrc: Oral Oral Oral Oral  SpO2: 94% 94%  95% 96%  Weight:      Height:        Intake/Output Summary (Last 24 hours) at 09/25/2018 1152 Last data filed at 09/25/2018 0700 Gross per 24 hour  Intake 960 ml  Output 1100 ml  Net -140 ml   Filed Weights   09/20/18 2250  Weight: (!) 174 kg   Gen: Pleasant, obese female in no distress  Pulm: Nonlabored breathing supplemental oxygen at rest, slightly tachypneic still, no wheezing or crackles. CV: Regular rate and rhythm. No murmur, rub, or gallop. No JVD, no significant dependent edema. GI: Abdomen soft, non-tender, non-distended, with normoactive bowel sounds.  Ext: Warm, no deformities Skin: No rashes, lesions or ulcers on visualized skin. Perineum not examined. Venous stasis changes R > LLE's noted to be stable. Neuro: Alert and oriented. No focal neurological deficits. Psych: Judgement and insight appear fair. Mood euthymic & affect congruent. Behavior is appropriate.    Gen: Pleasant and obese female in no distress Pulm: Nonlabored breathing supplemental oxygen at rest. Clear. CV: Regular rate and rhythm. No murmur, rub, or gallop. No JVD, no dependent edema. GI: Abdomen soft, non-tender, non-distended, with normoactive bowel sounds.  Ext: Warm, no deformities Skin: No new rashes, lesions or ulcers on limited visualized skin. Neuro: Alert and oriented. No focal neurological deficits. Psych: Judgement and insight appear fair. Mood euthymic & affect congruent. Behavior is appropriate.    Data Reviewed: I have personally reviewed following labs and imaging studies  CBC: Recent Labs  Lab 09/21/18 0345 09/22/18 0500 09/23/18 0300 09/24/18 0433 09/25/18 0405  WBC 10.6* 13.8* 11.6* 11.0* 11.2*  NEUTROABS 9.7* 12.7* 10.5* 9.7* 9.9*  HGB 12.9 12.2 12.1 12.2 11.7*  HCT 41.6 40.2 40.0 40.5 38.8  MCV 88.1 88.7 88.5 89.2 88.8  PLT 261 287 291 282 546   Basic Metabolic Panel: Recent Labs  Lab 09/21/18 0345 09/22/18 0500 09/23/18 0300 09/24/18 0433 09/25/18 0405  NA 139 138 141 143 141  K 3.1* 4.5 4.2 4.7 4.9  CL 99 101 103 105 107  CO2 _0 GLUCOSE 108* 130* 113* 135* 138*  BUN 43* 42* 43* 42* 40*  CREATININE 2.77* 2.08* 1.53* 1.12* 0.99  CALCIUM 8.4* 8.8* 9.0 9.2 8.7*  MG 2.4  --   --   --   --   PHOS 3.9  --   --   --   --    GFR: Estimated  Creatinine Clearance: 103.4 mL/min (by C-G formula based on SCr of 0.99 mg/dL). Liver Function Tests: Recent Labs  Lab 09/21/18 0345 09/22/18 0500 09/23/18 0300 09/24/18 0433 09/25/18 0405  AST 17 13* 11* 14* 17  ALT _1 ALKPHOS 78 74 72 64 62  BILITOT 0.9 0.4 0.3 0.1* 0.3  PROT 7.3 6.5 6.9 6.9 6.6  ALBUMIN 3.4* 3.2* 3.4* 3.4* 3.2*   No results for input(s): LIPASE, AMYLASE in the last 168 hours. No results for input(s): AMMONIA in the last 168 hours. Coagulation Profile: No results for input(s): INR, PROTIME in the last 168 hours. Cardiac Enzymes: Recent Labs  Lab 09/21/18 0345  CKTOTAL 165   BNP (last 3 results) No results for input(s): PROBNP in the last 8760 hours. HbA1C: No results for input(s): HGBA1C in the last 72 hours. CBG: No results for input(s): GLUCAP in the last 168 hours. Lipid Profile: No results for input(s): CHOL, HDL, LDLCALC, TRIG, CHOLHDL, LDLDIRECT in the last 72 hours. Thyroid Function Tests: No results  for input(s): TSH, T4TOTAL, FREET4, T3FREE, THYROIDAB in the last 72 hours. Anemia Panel: No results for input(s): VITAMINB12, FOLATE, FERRITIN, TIBC, IRON, RETICCTPCT in the last 72 hours. Urine analysis:    Component Value Date/Time   COLORURINE YELLOW 09/21/2018 1445   APPEARANCEUR CLEAR 09/21/2018 1445   LABSPEC 1.011 09/21/2018 1445   PHURINE 5.0 09/21/2018 1445   GLUCOSEU NEGATIVE 09/21/2018 1445   HGBUR NEGATIVE 09/21/2018 [place] 09/21/2018 1445   KETONESUR NEGATIVE 09/21/2018 1445   PROTEINUR NEGATIVE 09/21/2018 1445   NITRITE NEGATIVE 09/21/2018 [place] 09/21/2018 1445   Recent Results (from the past 240 hour(s))  Blood Culture (routine x 2)     Status: None   Collection Time: 09/20/18  2:00 PM   Specimen: BLOOD  Result Value Ref Range Status   Specimen Description   Final    BLOOD LEFT ANTECUBITAL Blood Culture adequate volume   Special Requests NONE  Final   Culture    Final    NO GROWTH 5 DAYS Performed at [facility], [address] [place], [place] [address]    Report Status 09/25/2018 FINAL  Final  Blood Culture (routine x 2)     Status: None   Collection Time: 09/20/18  4:18 PM   Specimen: BLOOD  Result Value Ref Range Status   Specimen Description BLOOD RIGHT ANTECUBITAL  Final   Special Requests   Final    BOTTLES DRAWN AEROBIC AND ANAEROBIC Blood Culture adequate volume   Culture   Final    NO GROWTH 5 DAYS Performed at [facility], [address] [place], [place] [address]    Report Status 09/25/2018 FINAL  Final  Culture, sputum-assessment     Status: None   Collection Time: 09/21/18 12:18 AM   Specimen: Sputum  Result Value Ref Range Status   Specimen Description SPUTUM  Final   Special Requests NONE  Final   Sputum evaluation   Final    THIS SPECIMEN IS ACCEPTABLE FOR SPUTUM CULTURE Performed at [facility], [place] [address], [place], [place] [address]    Report Status 09/21/2018 FINAL  Final  Culture, respiratory     Status: None   Collection Time: 09/21/18 12:18 AM   Specimen: SPU  Result Value Ref Range Status   Specimen Description   Final    SPUTUM Performed at [place] [address], [place], [place] [address]    Special Requests   Final    NONE Reflexed from [id number] Performed at [facility], [place] [address] [place], [place] [address]    Gram Stain   Final    RARE WBC PRESENT,BOTH PMN AND MONONUCLEAR RARE GRAM POSITIVE COCCI    Culture   Final    Consistent with normal respiratory flora. Performed at [facility], [place] [address], [place], [place] [address]    Report Status 09/23/2018 FINAL  Final  MRSA PCR Screening     Status: None   Collection Time: 09/21/18 12:47 AM   Specimen: Nasal Mucosa; Nasopharyngeal  Result Value Ref Range Status   MRSA by PCR NEGATIVE NEGATIVE Final    Comment:        The GeneXpert MRSA  Assay (FDA approved for NASAL specimens only), is one component of a comprehensive MRSA colonization surveillance program. It is not intended to diagnose MRSA infection nor to guide or monitor treatment for MRSA infections. Performed at [facility],  [place] [address] [place], [place] [address]       Radiology Studies: No results found.  Scheduled Meds: . amitriptyline  50 mg Oral [id number]  . docusate sodium  200 mg Oral BID  . enoxaparin (LOVENOX) injection  85 mg Subcutaneous Q24H  . liver oil-zinc oxide   Topical Q12H  . mouth rinse  15 mL Mouth Rinse BID  . methylPREDNISolone (SOLU-MEDROL) injection  40 mg Intravenous Q12H  . pneumococcal 23 valent vaccine  0.5 mL Intramuscular Tomorrow-1000   Continuous Infusions: . remdesivir 100 mg in NS 250 mL 100 mg (09/24/18 1139)     LOS: 5 days   Time spent: 25 minutes.  Patrecia Pour, MD [facility] [email or web address] Password [facility] 09/25/2018, 11:52 AM

## 2018-09-26 LAB — COMPREHENSIVE METABOLIC PANEL
ALT: 22 U/L (ref 0–44)
AST: 15 U/L (ref 15–41)
Albumin: 3.3 g/dL — ABNORMAL LOW (ref 3.5–5.0)
Alkaline Phosphatase: 60 U/L (ref 38–126)
Anion gap: 12 (ref 5–15)
BUN: 39 mg/dL — ABNORMAL HIGH (ref 6–20)
CO2: 25 mmol/L (ref 22–32)
Calcium: 8.8 mg/dL — ABNORMAL LOW (ref 8.9–10.3)
Chloride: 105 mmol/L (ref 98–111)
Creatinine, Ser: 0.97 mg/dL (ref 0.44–1.00)
GFR calc Af Amer: >60 mL/min (ref >60)
GFR calc non Af Amer: >60 mL/min (ref >60)
Glucose, Bld: 93 mg/dL (ref 70–99)
Potassium: 5 mmol/L (ref 3.5–5.1)
Sodium: 142 mmol/L (ref 135–145)
Total Bilirubin: 0.3 mg/dL (ref 0.3–1.2)
Total Protein: 6.5 g/dL (ref 6.5–8.1)

## 2018-09-26 MED ORDER — ALUM & MAG HYDROXIDE-SIMETH 200-200-20 MG/5ML PO SUSP
30.0000 mL | ORAL | Status: DC | PRN
  Administered 2018-09-26: 30 mL via ORAL
  Filled 2018-09-26: qty 30

## 2018-09-26 NOTE — Progress Notes (Signed)
Occupational Therapy Treatment Patient Details Name: MIRYAM MCELHINNEY MRN: [id number] DOB: 1966-03-23 Today's Date: 09/26/2018    History of present illness 53 y.o. female with medical history significant of morbid obesity, sleep apnea not using CPAP, hypertension, chronic pain syndrome who is presenting to the emergency room with shortness of breath and hypoxia measured at primary care physician's office on follow-up. Tesed COIVD positive.   OT comments  Pt progressing towards OT goals this session, Pt continues to de-saturate with movement/exertion. Coming to EOB Pt decreased to 82% on RA, was able to ambulate on 2L O2 and remain above 90%, HR increased from 98-136 during session, requiring 4 standing rest breaks during hallway ambulation. Pt was able to perform toilet transfer and peri care at supervision, and reviewed use of toilet aide for rear peri care. OT POC remains appropriate at this time.    Follow Up Recommendations  No OT follow up;Supervision - Intermittent    Equipment Recommendations  None recommended by OT    Recommendations for Other Services PT consult    Precautions / Restrictions Precautions Precautions: Fall Precaution Comments: monitor sats Restrictions Weight Bearing Restrictions: No       Mobility Bed Mobility Overal bed mobility: Needs Assistance Bed Mobility: Supine to Sit;Sit to Supine     Supine to sit: Supervision Sit to supine: Supervision   General bed mobility comments: supervision for safety  Transfers Overall transfer level: Needs assistance Equipment used: None Transfers: Sit to/from Stand Sit to Stand: Supervision         General transfer comment: Supervision for safety    Balance Overall balance assessment: Mild deficits observed, not formally tested                                         ADL either performed or assessed with clinical judgement   ADL Overall ADL's : Needs assistance/impaired      Grooming: Standing;Wash/dry hands;Supervision/safety Grooming Details (indicate cue type and reason): sink level, standing rest break after walking to sink             Lower Body Dressing: Minimal assistance;Sitting/lateral leans Lower Body Dressing Details (indicate cue type and reason): to don slip on shoes Toilet Transfer: Supervision/safety;Ambulation;Regular Toilet;Grab bars   Toileting- Clothing Manipulation and Hygiene: Sit to/from stand;Min guard Toileting - Clothing Manipulation Details (indicate cue type and reason): for front peri care - Pt was able to verbalize how to use toilet aide for rear peri care     Functional mobility during ADLs: Supervision/safety General ADL Comments: reviewed energy conservation tips/education     Vision       Perception     Praxis      Cognition Arousal/Alertness: Awake/alert Behavior During Therapy: WFL for tasks assessed/performed Overall Cognitive Status: Within Functional Limits for tasks assessed                                          Exercises     Shoulder Instructions       General Comments      Pertinent Vitals/ Pain       Pain Assessment: No/denies pain Pain Intervention(s): Monitored during session  Home Living  Prior Functioning/Environment              Frequency  Min 3X/week        Progress Toward Goals  OT Goals(current goals can now be found in the care plan section)  Progress towards OT goals: Progressing toward goals  Acute Rehab OT Goals Patient Stated Goal: Go home OT Goal Formulation: With patient Time For Goal Achievement: 10/05/18 Potential to Achieve Goals: Good  Plan Discharge plan remains appropriate    Co-evaluation                 AM-PAC OT "6 Clicks" Daily Activity     Outcome Measure   Help from another person eating meals?: None Help from another person taking care of personal  grooming?: A Little Help from another person toileting, which includes using toliet, bedpan, or urinal?: A Little Help from another person bathing (including washing, rinsing, drying)?: A Little Help from another person to put on and taking off regular upper body clothing?: A Little Help from another person to put on and taking off regular lower body clothing?: A Little 6 Click Score: 19    End of Session Equipment Utilized During Treatment: Oxygen(1L)  OT Visit Diagnosis: Unsteadiness on feet (R26.81);Other abnormalities of gait and mobility (R26.89);Muscle weakness (generalized) (M62.81)   Activity Tolerance Patient tolerated treatment well   Patient Left in bed;with call bell/phone within reach   Nurse Communication Mobility status        Time: 2505-3976 OT Time Calculation (min): 31 min  Charges: OT General Charges $OT Visit: 1 Visit OT Treatments $Self Care/Home Management : 8-22 mins $Therapeutic Activity: 8-22 mins  Sherryl Manges OTR/L Acute Rehabilitation Services Pager: [phone] Office: [phone]  [employer] Derreck Wiltsey 09/26/2018, 12:49 PM

## 2018-09-26 NOTE — Progress Notes (Signed)
Pt ready for discharge home with family. Daughter will pick pt up. Pt will go home with oxygen canister and finger monitor. Delivered to pts room. Discussed all discharge instructions and medications as well as f/u appointments and covid quarentine intstructions.

## 2018-09-26 NOTE — TOC Transition Note (Signed)
Transition of Care [facility]) - CM/SW Discharge Note   Patient Details  Name: Claudia Hoffman MRN: [id number] Date of Birth: May 19, 1965  [facility]) CM/SW Contact:  Midge Minium RN, BSN, NCM-BC, ACM-RN [phone] (working remotely) Phone Number: 09/26/2018, 11:48 AM   Clinical Narrative:    CM spoke to the patient via phone to discuss the POC. The patient lives at home with her daughter and states being independent PTA. PCP verified as: Dr. Fulton Reek; insurance: [employer] [place]. Home oxygen will be required at discharge. DME preference list discussed with [employer] selected. Patient indicated her daughter, Keir Foland, can be contacted to arrange delivery of the oxygen concentrator. DME referral given to Learta Codding, [place] liaison; AVS updated. Patient stated her daughter/ex-husband will provide transportation home. The patient will be provided a portable oxygen tank and pulse ox device before discharge; patient reports having a working thermometer. No further needs from CM.     Final next level of care: Home/Self Care Barriers to Discharge: No Barriers Identified   Patient Goals and CMS Choice Patient states their goals for this hospitalization and ongoing recovery are:: "to recover" CMS Medicare.gov Compare Post Acute Care list provided to:: Patient Choice offered to / list presented to : Patient   Discharge Plan and Services            DME Arranged: Oxygen DME Agency: [place] Date DME Agency Contacted: 09/26/18 Time DME Agency Contacted: [id number] Representative spoke with at DME Agency: Learta Codding HH Arranged: NA [place] Agency: NA        Social Determinants of Health ([place]) Interventions     Readmission Risk Interventions No flowsheet data found.

## 2018-09-26 NOTE — Discharge Summary (Signed)
Physician Discharge Summary  Claudia Hoffman WFU:[id number] DOB: 1965/08/02 DOA: 09/20/2018  PCP: Idelle Crouch, MD  Admit date: 09/20/2018 Discharge date: 09/26/2018  Admitted From: Home Disposition: Home   Recommendations for Outpatient Follow-up:  1. Follow up with PCP in 1-2 weeks 2. Please obtain CMP and CBC  Home Health: None Equipment/Devices: 2L O2 Discharge Condition: Stable CODE STATUS: Full Diet recommendation: Heart healthy  Brief/Interim Summary: Claudia Hoffman is a 53 y.o. 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 She had been seen by her PCP several times, initially diagnosed with ear and sinus infection put on steroids, augmentin, and inhaler for a week, later changing to levaquin and azithromycin for 5 days. In the ED she was hypoxic with CXR infiltrates bilaterally and found to have renal failure with creatinine to 2.77. She was admitted at [facility], started on steroids. Creatinine improved and a course of remdesivir was given 6/25 - 6/29 with liberation from oxygen at rest, still requiring 2L with ambulation which will be arranged at home.   Discharge Diagnoses:  Principal Problem:   Pneumonia due to COVID-19 virus Active Problems:   Morbid obesity with BMI of 60.0-69.9, adult (HCC)   Obstructive sleep apnea of adult   Iron deficiency anemia due to dietary causes   GERD without esophagitis   Essential hypertension   Hypokalemia   Acute renal failure (ARF) (HCC)  Acute hypoxic respiratory failure due to covid-19 pneumonia:  - Continue exertional oxygen and recheck SpO2 at PCP follow up. - Continue self isolation for 2 weeks or as recommended by PCP. - Completed 5 day course of steroids and remdesivir on 6/29. Also had full course of  steroids prior to admission. LFTs remained nonelevated throughout. - Started remdesivir 6/25. Will complete 6/29. - Recommend aggressive use of incentive spirometry.    Acute renal failure: Creatinine 2.7 on admission up from 0.9 in April and prior. Resolved, returned to baseline. Due to dehydration, also possible AIN/nephrotoxicity of medications (abx, NSAIDs, and ARB) and possibly secondary to microthrombotic phenomenon from covid. No significant proteinuria on urinalysis.  - Avoid nephrotoxins, ok to restart home medications  Diarrhea: Resolved. Suspected to be antibiotic-associated and/or due to covid infection.  - Monitor.  - Desitin topically to allow healing.   Morbid obesity: BMI is 69. Recent physical in April 2020 showed LDL 97, HbA1c 5.9% - Weight loss long term is recommended.   Hypokalemia: Resolved.  HTN: Restart home medications at discharge.   RA: Reported Dx, not yet established with rheumatology. ESR was 85.  - Avoid NSAIDs if possible  Discharge Instructions Discharge Instructions    Diet - low sodium heart healthy   Complete by: As directed    Discharge instructions   Complete by: As directed    You are felt to be stable enough to no longer require inpatient monitoring, testing, and treatment, though you will need to follow the recommendations below: - It is recommended that you continue in self isolation for 2 weeks to reduce risk of transmission of the virus. - You may take tylenol as needed for mild-moderate pain or fever. Do not take NSAID medications (including, but not limited to, ibuprofen, advil, motrin, naproxen, aleve, goody's powder, etc.) until follow up with your doctor. - Follow up with your  doctor in the next week via telehealth or seek medical attention right away if your symptoms get WORSE.  - Consider donating plasma after you have recovered (either 14 days after a negative test or 28 days after symptoms have completely resolved) because your  antibodies to this virus may be helpful to give to others with life-threatening infections. Please go to the website www.[email or web address] if you would like to consider volunteering for plasma donation.    Directions for you at home:  Wear a facemask You should wear a facemask that covers your nose and mouth when you are in the same room with other people and when you visit a healthcare provider. People who live with or visit you should also wear a facemask while they are in the same room with you.  Separate yourself from other people in your home As much as possible, you should stay in a different room from other people in your home. Also, you should use a separate bathroom, if available.  Avoid sharing household items You should not share dishes, drinking glasses, cups, eating utensils, towels, bedding, or other items with other people in your home. After using these items, you should wash them thoroughly with soap and water.  Cover your coughs and sneezes Cover your mouth and nose with a tissue when you cough or sneeze, or you can cough or sneeze into your sleeve. Throw used tissues in a lined trash can, and immediately wash your hands with soap and water for at least 20 seconds or use an alcohol-based hand rub.  Wash your [facility] your hands often and thoroughly with soap and water for at least 20 seconds. You can use an alcohol-based hand sanitizer if soap and water are not available and if your hands are not visibly dirty. Avoid touching your eyes, nose, and mouth with unwashed hands.  Directions for those who live with, or provide care at home for you:  Limit the number of people who have contact with the patient If possible, have only one caregiver for the patient. Other household members should stay in another home or place of residence. If this is not possible, they should stay in another room, or be separated from the patient as much as possible. Use a separate bathroom, if  available. Restrict visitors who do not have an essential need to be in the home.  Ensure good ventilation Make sure that shared spaces in the home have good air flow, such as from an air conditioner or an opened window, weather permitting.  Wash your hands often Wash your hands often and thoroughly with soap and water for at least 20 seconds. You can use an alcohol based hand sanitizer if soap and water are not available and if your hands are not visibly dirty. Avoid touching your eyes, nose, and mouth with unwashed hands. Use disposable paper towels to dry your hands. If not available, use dedicated cloth towels and replace them when they become wet.  Wear a facemask and gloves Wear a disposable facemask at all times in the room and gloves when you touch or have contact with the patient's blood, body fluids, and/or secretions or excretions, such as sweat, saliva, sputum, nasal mucus, vomit, urine, or feces.  Ensure the mask fits over your nose and mouth tightly, and do not touch it during use. Throw out disposable facemasks and gloves after using them. Do not reuse. Wash your hands immediately after removing your facemask and gloves. If your personal clothing becomes contaminated,  carefully remove clothing and launder. Wash your hands after handling contaminated clothing. Place all used disposable facemasks, gloves, and other waste in a lined container before disposing them with other household waste. Remove gloves and wash your hands immediately after handling these items.  Do not share dishes, glasses, or other household items with the patient Avoid sharing household items. You should not share dishes, drinking glasses, cups, eating utensils, towels, bedding, or other items with a patient who is confirmed to have, or being evaluated for, COVID-19 infection. After the person uses these items, you should wash them thoroughly with soap and water.  Wash laundry thoroughly Immediately remove  and wash clothes or bedding that have blood, body fluids, and/or secretions or excretions, such as sweat, saliva, sputum, nasal mucus, vomit, urine, or feces, on them. Wear gloves when handling laundry from the patient. Read and follow directions on labels of laundry or clothing items and detergent. In general, wash and dry with the warmest temperatures recommended on the label.  Clean all areas the individual has used often Clean all touchable surfaces, such as counters, tabletops, doorknobs, bathroom fixtures, toilets, phones, keyboards, tablets, and bedside tables, every day. Also, clean any surfaces that may have blood, body fluids, and/or secretions or excretions on them. Wear gloves when cleaning surfaces the patient has come in contact with. Use a diluted bleach solution (e.g., dilute bleach with 1 part bleach and 10 parts water) or a household disinfectant with a label that says EPA-registered for coronaviruses. To make a bleach solution at home, add 1 tablespoon of bleach to 1 quart (4 cups) of water. For a larger supply, add  cup of bleach to 1 gallon (16 cups) of water. Read labels of cleaning products and follow recommendations provided on product labels. Labels contain instructions for safe and effective use of the cleaning product including precautions you should take when applying the product, such as wearing gloves or eye protection and making sure you have good ventilation during use of the product. Remove gloves and wash hands immediately after cleaning.  Monitor yourself for signs and symptoms of illness Caregivers and household members are considered close contacts, should monitor their health, and will be asked to limit movement outside of the home to the extent possible. Follow the monitoring steps for close contacts listed on the symptom monitoring form.   If you have additional questions, contact your local health department or call the epidemiologist on call at  [phone] (available 24/7). This guidance is subject to change. For the most up-to-date guidance from [facility], please refer to their website: YouBlogs.pl   Increase activity slowly   Complete by: As directed      Allergies as of 09/26/2018      Reactions   Other Other (See Comments)   LETTUCE, BEEF AND VANILLA CAUSE SNEEZING-DENIES ANY SWELLING OR SOB      Medication List    STOP taking these medications   azithromycin 250 MG tablet Commonly known as: ZITHROMAX   etodolac 500 MG tablet Commonly known as: LODINE   ibuprofen 200 MG tablet Commonly known as: ADVIL   levofloxacin 500 MG tablet Commonly known as: LEVAQUIN   predniSONE 20 MG tablet Commonly known as: DELTASONE     TAKE these medications   acetaminophen 650 MG CR tablet Commonly known as: TYLENOL Take 1,300 mg by mouth daily as needed for pain.   albuterol 108 (90 Base) MCG/ACT inhaler Commonly known as: VENTOLIN HFA Inhale 2 puffs into the lungs every 6 (  six) hours as needed for wheezing or shortness of breath.   amitriptyline 50 MG tablet Commonly known as: ELAVIL Take 50 mg by mouth every morning.   docusate sodium 100 MG capsule Commonly known as: COLACE Take 200 mg by mouth 2 (two) times daily.   Hydromet 5-1.5 MG/5ML syrup Generic drug: HYDROcodone-homatropine Take 5 mLs by mouth every 6 (six) hours as needed for cough.   losartan-hydrochlorothiazide 100-12.5 MG tablet Commonly known as: HYZAAR Take 1 tablet by mouth daily.   polyethylene glycol powder 17 GM/SCOOP powder Commonly known as: GLYCOLAX/MIRALAX Take 17 g by mouth daily as needed for mild constipation.   traMADol 50 MG tablet Commonly known as: ULTRAM Take 50 mg by mouth every 6 (six) hours as needed for moderate pain.            Durable Medical Equipment  (From admission, onward)         Start     Ordered   09/26/18 0907  DME Oxygen  Once    Question  Answer Comment  Length of Need 6 Months   Mode or (Route) Nasal cannula   Liters per Minute 2   Frequency Continuous (stationary and portable oxygen unit needed)   Oxygen delivery system Gas      09/26/18 0906         Follow-up Information    Idelle Crouch, MD. Schedule an appointment as soon as possible for a visit in 1 week(s).   Specialty: Internal Medicine Contact information: [place] [address] [phone]        [place] Follow up.   Why: home oxygen Contact information: [place] [address] [phone]          Allergies  Allergen Reactions  . Other Other (See Comments)    LETTUCE, BEEF AND VANILLA CAUSE SNEEZING-DENIES ANY SWELLING OR SOB    Consultations:  None  Procedures/Studies: Dg Chest Port 1 View  Result Date: 09/20/2018 CLINICAL DATA:  Worsening shortness of breath. COVID-19 positive. EXAM: PORTABLE CHEST 1 VIEW COMPARISON:  Report from 09/20/2018 chest radiographs (images not available). Chest CTA 03/20/2016. FINDINGS: The cardiac silhouette is mildly enlarged. Patchy airspace opacities are present in the mid and lower lungs bilaterally, also described on today's earlier study. Small pleural effusions are not excluded. No pneumothorax is identified. No acute osseous abnormality is seen. IMPRESSION: Bilateral airspace opacities consistent with pneumonia. Electronically Signed   By: Logan Bores M.D.   On: 09/20/2018 14:54   Dg Foot 2 Views Left  Result Date: 08/31/2018 Please see detailed radiograph report in office note.  Mm 3d Screen Breast Bilateral  Result Date: 08/29/2018 CLINICAL DATA:  Screening. EXAM: DIGITAL SCREENING BILATERAL MAMMOGRAM WITH TOMO AND CAD COMPARISON:  Previous exam(s). ACR Breast Density Category b: There are scattered areas of fibroglandular density. FINDINGS: There are no findings suspicious for malignancy. Images were processed with CAD. IMPRESSION: No  mammographic evidence of malignancy. A result letter of this screening mammogram will be mailed directly to the patient. RECOMMENDATION: Screening mammogram in one year. (Code:SM-B-01Y) BI-RADS CATEGORY  1: Negative. Electronically Signed   By: Lillia Mountain M.D.   On: 08/29/2018 12:15    Subjective: Off oxygen at rest, diarrhea still resolved, no dyspnea at rest. Didn't sleep much at all last night.   Discharge Exam: Vitals:   09/26/18 0315 09/26/18 0859  BP: 129/76 130/70  Pulse: 66   Resp:    Temp: 98.6 F (37 C)  98.1 F (36.7 C)  SpO2: 97%    General: Pt is alert, awake, not in acute distress Cardiovascular: RRR, S1/S2 +, no rubs, no gallops Respiratory: Nonlabored and clear. Abdominal: Soft, NT, ND, bowel sounds + Extremities: No significant edema, no cyanosis  Labs: BNP (last 3 results) No results for input(s): BNP in the last 8760 hours. Basic Metabolic Panel: Recent Labs  Lab 09/21/18 0345 09/22/18 0500 09/23/18 0300 09/24/18 0433 09/25/18 0405 09/26/18 0835  NA 139 138 141 143 141 142  K 3.1* 4.5 4.2 4.7 4.9 5.0  CL 99 101 103 105 107 105  CO2 _0 GLUCOSE 108* 130* 113* 135* 138* 93  BUN 43* 42* 43* 42* 40* 39*  CREATININE 2.77* 2.08* 1.53* 1.12* 0.99 0.97  CALCIUM 8.4* 8.8* 9.0 9.2 8.7* 8.8*  MG 2.4  --   --   --   --   --   PHOS 3.9  --   --   --   --   --    Liver Function Tests: Recent Labs  Lab 09/22/18 0500 09/23/18 0300 09/24/18 0433 09/25/18 0405 09/26/18 0835  AST 13* 11* 14* 17 15  ALT _1 ALKPHOS 74 72 64 62 60  BILITOT 0.4 0.3 0.1* 0.3 0.3  PROT 6.5 6.9 6.9 6.6 6.5  ALBUMIN 3.2* 3.4* 3.4* 3.2* 3.3*   No results for input(s): LIPASE, AMYLASE in the last 168 hours. No results for input(s): AMMONIA in the last 168 hours. CBC: Recent Labs  Lab 09/21/18 0345 09/22/18 0500 09/23/18 0300 09/24/18 0433 09/25/18 0405  WBC 10.6* 13.8* 11.6* 11.0* 11.2*  NEUTROABS 9.7* 12.7* 10.5* 9.7* 9.9*  HGB 12.9 12.2 12.1  12.2 11.7*  HCT 41.6 40.2 40.0 40.5 38.8  MCV 88.1 88.7 88.5 89.2 88.8  PLT 261 287 291 282 267   Cardiac Enzymes: Recent Labs  Lab 09/21/18 0345  CKTOTAL 165   BNP: Invalid input(s): POCBNP CBG: No results for input(s): GLUCAP in the last 168 hours. D-Dimer No results for input(s): DDIMER in the last 72 hours. Hgb A1c No results for input(s): HGBA1C in the last 72 hours. Lipid Profile No results for input(s): CHOL, HDL, LDLCALC, TRIG, CHOLHDL, LDLDIRECT in the last 72 hours. Thyroid function studies No results for input(s): TSH, T4TOTAL, T3FREE, THYROIDAB in the last 72 hours.  Invalid input(s): FREET3 Anemia work up No results for input(s): VITAMINB12, FOLATE, FERRITIN, TIBC, IRON, RETICCTPCT in the last 72 hours. Urinalysis    Component Value Date/Time   COLORURINE YELLOW 09/21/2018 1445   APPEARANCEUR CLEAR 09/21/2018 1445   LABSPEC 1.011 09/21/2018 1445   PHURINE 5.0 09/21/2018 1445   GLUCOSEU NEGATIVE 09/21/2018 1445   HGBUR NEGATIVE 09/21/2018 [place] 09/21/2018 [place] 09/21/2018 [place] 09/21/2018 1445   NITRITE NEGATIVE 09/21/2018 [place] 09/21/2018 1445    Microbiology Recent Results (from the past 240 hour(s))  Blood Culture (routine x 2)     Status: None   Collection Time: 09/20/18  2:00 PM   Specimen: BLOOD  Result Value Ref Range Status   Specimen Description   Final    BLOOD LEFT ANTECUBITAL Blood Culture adequate volume   Special Requests NONE  Final   Culture   Final    NO GROWTH 5 DAYS Performed at [facility], [address] [place], [place] [address]    Report Status 09/25/2018 FINAL  Final  Blood Culture (routine x 2)     Status: None   Collection Time: 09/20/18  4:18 PM   Specimen: BLOOD  Result Value Ref Range Status   Specimen Description BLOOD RIGHT ANTECUBITAL  Final   Special Requests   Final    BOTTLES DRAWN AEROBIC AND ANAEROBIC Blood  Culture adequate volume   Culture   Final    NO GROWTH 5 DAYS Performed at [facility], [address], [place], [place] [address]    Report Status 09/25/2018 FINAL  Final  Culture, sputum-assessment     Status: None   Collection Time: 09/21/18 12:18 AM   Specimen: Sputum  Result Value Ref Range Status   Specimen Description SPUTUM  Final   Special Requests NONE  Final   Sputum evaluation   Final    THIS SPECIMEN IS ACCEPTABLE FOR SPUTUM CULTURE Performed at [facility], [place] [address], [place], [place] [address]    Report Status 09/21/2018 FINAL  Final  Culture, respiratory     Status: None   Collection Time: 09/21/18 12:18 AM   Specimen: SPU  Result Value Ref Range Status   Specimen Description   Final    SPUTUM Performed at [facility] [address], [place], [place] [address]    Special Requests   Final    NONE Reflexed from [id number] Performed at [facility], [place] [address], [place], [place] [address]    Gram Stain   Final    RARE WBC PRESENT,BOTH PMN AND MONONUCLEAR RARE GRAM POSITIVE COCCI    Culture   Final    Consistent with normal respiratory flora. Performed at [facility], [place] [address] [place], [place] [address]    Report Status 09/23/2018 FINAL  Final  MRSA PCR Screening     Status: None   Collection Time: 09/21/18 12:47 AM   Specimen: Nasal Mucosa; Nasopharyngeal  Result Value Ref Range Status   MRSA by PCR NEGATIVE NEGATIVE Final    Comment:        The GeneXpert MRSA Assay (FDA approved for NASAL specimens only), is one component of a comprehensive MRSA colonization surveillance program. It is not intended to diagnose MRSA infection nor to guide or monitor treatment for MRSA infections. Performed at [facility], [place] [address] [place], [place] [address]     Time coordinating discharge: Approximately 40 minutes  Patrecia Pour, MD  [facility] 09/26/2018, 12:04 PM

## 2018-11-16 ENCOUNTER — Other Ambulatory Visit: Payer: Self-pay | Admitting: Internal Medicine

## 2018-11-16 DIAGNOSIS — J849 Interstitial pulmonary disease, unspecified: Secondary | ICD-10-CM

## 2018-11-22 ENCOUNTER — Other Ambulatory Visit: Payer: Self-pay

## 2018-11-22 ENCOUNTER — Other Ambulatory Visit (HOSPITAL_COMMUNITY): Payer: Self-pay | Admitting: Physician Assistant

## 2018-11-22 ENCOUNTER — Ambulatory Visit
Admission: RE | Admit: 2018-11-22 | Discharge: 2018-11-22 | Disposition: A | Discharge status: Home / Self Care | Payer: PRIVATE HEALTH INSURANCE | Source: Ambulatory Visit | Attending: Physician Assistant | Admitting: Physician Assistant

## 2018-11-22 ENCOUNTER — Other Ambulatory Visit: Payer: Self-pay | Admitting: Physician Assistant

## 2018-11-22 DIAGNOSIS — M79604 Pain in right leg: Secondary | ICD-10-CM | POA: Diagnosis present

## 2018-11-22 DIAGNOSIS — R000 Tachycardia, unspecified: Secondary | ICD-10-CM

## 2018-11-22 DIAGNOSIS — R609 Edema, unspecified: Secondary | ICD-10-CM | POA: Diagnosis present

## 2018-11-22 DIAGNOSIS — R0602 Shortness of breath: Secondary | ICD-10-CM

## 2018-11-22 MED ORDER — IOHEXOL 350 MG/ML SOLN
75.0000 mL | Freq: Once | INTRAVENOUS | Status: AC | PRN
  Administered 2018-11-22: 17:00:00 75 mL via INTRAVENOUS

## 2018-11-24 ENCOUNTER — Ambulatory Visit: Payer: PRIVATE HEALTH INSURANCE

## 2019-11-07 ENCOUNTER — Other Ambulatory Visit: Payer: Self-pay | Admitting: Internal Medicine

## 2019-11-07 DIAGNOSIS — Z1231 Encounter for screening mammogram for malignant neoplasm of breast: Secondary | ICD-10-CM

## 2019-12-25 ENCOUNTER — Ambulatory Visit
Admission: RE | Admit: 2019-12-25 | Discharge: 2019-12-25 | Disposition: A | Discharge status: Home / Self Care | Payer: 59 | Source: Ambulatory Visit | Attending: Internal Medicine | Admitting: Internal Medicine

## 2019-12-25 ENCOUNTER — Other Ambulatory Visit: Payer: Self-pay

## 2019-12-25 DIAGNOSIS — Z1231 Encounter for screening mammogram for malignant neoplasm of breast: Secondary | ICD-10-CM | POA: Insufficient documentation

## 2020-02-13 ENCOUNTER — Other Ambulatory Visit: Payer: Self-pay | Admitting: Internal Medicine

## 2020-02-13 DIAGNOSIS — R1031 Right lower quadrant pain: Secondary | ICD-10-CM

## 2020-02-14 ENCOUNTER — Ambulatory Visit
Admission: RE | Admit: 2020-02-14 | Discharge: 2020-02-14 | Disposition: A | Discharge status: Home / Self Care | Payer: 59 | Source: Ambulatory Visit | Attending: Internal Medicine | Admitting: Internal Medicine

## 2020-02-14 ENCOUNTER — Other Ambulatory Visit: Payer: Self-pay

## 2020-02-14 DIAGNOSIS — R1031 Right lower quadrant pain: Secondary | ICD-10-CM

## 2020-02-14 LAB — POCT I-STAT CREATININE: Creatinine, Ser: 0.8 mg/dL (ref 0.44–1.00)

## 2020-02-14 MED ORDER — IOHEXOL 300 MG/ML  SOLN
125.0000 mL | Freq: Once | INTRAMUSCULAR | Status: AC | PRN
  Administered 2020-02-14: 125 mL via INTRAVENOUS

## 2020-03-05 ENCOUNTER — Other Ambulatory Visit: Payer: Self-pay

## 2020-03-05 ENCOUNTER — Encounter: Payer: Self-pay | Admitting: Urology

## 2020-03-05 ENCOUNTER — Ambulatory Visit (INDEPENDENT_AMBULATORY_CARE_PROVIDER_SITE_OTHER): Payer: 59 | Admitting: Urology

## 2020-03-05 VITALS — BP 148/74 | HR 102 | Wt 375.0 lb

## 2020-03-05 DIAGNOSIS — N3000 Acute cystitis without hematuria: Secondary | ICD-10-CM | POA: Diagnosis not present

## 2020-03-05 DIAGNOSIS — N200 Calculus of kidney: Secondary | ICD-10-CM

## 2020-03-05 DIAGNOSIS — N201 Calculus of ureter: Secondary | ICD-10-CM | POA: Diagnosis not present

## 2020-03-05 DIAGNOSIS — M940 Chondrocostal junction syndrome [Tietze]: Secondary | ICD-10-CM | POA: Diagnosis not present

## 2020-03-05 LAB — URINALYSIS, COMPLETE
Bilirubin, UA: NEGATIVE
Glucose, UA: NEGATIVE
Ketones, UA: NEGATIVE
Nitrite, UA: NEGATIVE
Specific Gravity, UA: 1.015 (ref 1.005–1.030)
Urobilinogen, Ur: 2 mg/dL — ABNORMAL HIGH (ref 0.2–1.0)
pH, UA: 5.5 (ref 5.0–7.5)

## 2020-03-05 LAB — MICROSCOPIC EXAMINATION
RBC, Urine: >30 /hpf — AB (ref 0–2)
WBC, UA: >30 /hpf — AB (ref 0–5)

## 2020-03-05 MED ORDER — CEPHALEXIN 500 MG PO CAPS: 500.0000 mg | ORAL_CAPSULE | Freq: Three times a day (TID) | ORAL | 0 refills | Status: AC

## 2020-03-05 NOTE — Progress Notes (Signed)
03/05/2020 12:19 PM   Claudia Hoffman 12-07-1965 [id number]  Referring provider: Marguarite Arbour, MD [address] [facility] [place],  [place] [address]  Chief Complaint  Patient presents with  . Flank Pain    HPI: 54 year old female referred for further evaluation of kidney stones.  She underwent CT abdomen pelvis for right lower quadrant pain on 02/14/2020 that showed a 3 mm right distal ureteral calculus.  She also has an 8 mm nonobstructing stone in the right.  She was having severe pain around the time of the study.  She did not have any recurrent pain until Wednesday last week when she had a severe pain episode that lasted for few days.  This was associated with dry heaving/vomiting as well as fevers up to 102 per her daughter.  She denied any associated urinary symptoms including no dysuria or gross hematuria.  Her pain at this point has resolved.  She is concerned today about her lungs.  She has a personal history of pneumonia I am after the episode of dry heaving, has had a very sore chest/sternum today, she coughed up some phlegm which is worrisome to her.  She is not short of breath.  No personal history of stones.  No previous stone surgery.   PMH: Past Medical History:  Diagnosis Date  . Allergy   . Anemia   . Arthritis    osteo-knees, shoulders  . Asthma    well controlled  . Atopic dermatitis   . COVID   . Family history of adverse reaction to anesthesia    mom-nauseated after surgery  . Headache    migraines-1-2 per year  . Hypertension   . O2 dependent   . Obesity, Class III, BMI 40-49.9 (morbid obesity) (HCC) 11/2012   s/p bariatric sleeve surgery by Dr. Smitty Cords   . Sleep apnea    no cpap    Surgical History: Past Surgical History:  Procedure Laterality Date  . BAND HEMORRHOIDECTOMY  06/2016   DONE IN DR [facility] OFFICE  . CESAREAN SECTION  1997  . CHOLECYSTECTOMY  05/2012  . GASTRIC BYPASS  11/2012   sleeve  . IUD REMOVAL    .  SPHINCTEROTOMY N/A 09/25/2016   Procedure: [place] INTERNAL;  Surgeon: Nadeen Landau, MD;  Location: [facility] ORS;  Service: General;  Laterality: N/A;    Home Medications:  Allergies as of 03/05/2020      Reactions   Other Other (See Comments)   LETTUCE, BEEF AND VANILLA CAUSE SNEEZING-DENIES ANY SWELLING OR SOB      Medication List       Accurate as of March 05, 2020 12:19 PM. If you have any questions, ask your nurse or doctor.        STOP taking these medications   acetaminophen 650 MG CR tablet Commonly known as: TYLENOL Stopped by: Vanna Scotland, MD   docusate sodium 100 MG capsule Commonly known as: COLACE Stopped by: Vanna Scotland, MD   Hydromet 5-1.5 MG/5ML syrup Generic drug: HYDROcodone-homatropine Stopped by: Vanna Scotland, MD   polyethylene glycol powder 17 GM/SCOOP powder Commonly known as: GLYCOLAX/MIRALAX Stopped by: Vanna Scotland, MD   traMADol 50 MG tablet Commonly known as: ULTRAM Stopped by: Vanna Scotland, MD     TAKE these medications   albuterol 108 (90 Base) MCG/ACT inhaler Commonly known as: VENTOLIN HFA Inhale 2 puffs into the lungs every 6 (six) hours as needed for wheezing or shortness of breath.   amitriptyline 50 MG  tablet Commonly known as: ELAVIL Take 50 mg by mouth every morning.   cephALEXin 500 MG capsule Commonly known as: Keflex Take 1 capsule (500 mg total) by mouth 3 (three) times daily for 5 days. Started by: Vanna Scotland, MD   Eliquis 2.5 MG Tabs tablet Generic drug: apixaban Take 2.5 mg by mouth 2 (two) times daily.   losartan-hydrochlorothiazide 100-12.5 MG tablet Commonly known as: HYZAAR Take 1 tablet by mouth daily.   methylphenidate 54 MG CR tablet Commonly known as: CONCERTA Take 54 mg by mouth every morning. What changed: Another medication with the same name was removed. Continue taking this medication, and follow the directions you see here. Changed by: Vanna Scotland, MD    OXYGEN Inhale 2 L into the lungs.   tamsulosin 0.4 MG Caps capsule Commonly known as: FLOMAX Take by mouth.       Allergies:  Allergies  Allergen Reactions  . Other Other (See Comments)    LETTUCE, BEEF AND VANILLA CAUSE SNEEZING-DENIES ANY SWELLING OR SOB    Family History: Family History  Problem Relation Age of Onset  . Hypertension Mother   . Hypertension Father   . Heart disease Father   . Hodgkin's lymphoma Father 60  . Breast cancer Neg Hx     Social History:  reports that she has never smoked. She has never used smokeless tobacco. She reports current alcohol use. She reports that she does not use drugs.   Physical Exam: BP (!) 148/74   Pulse (!) 102   Wt (!) 375 lb (170.1 kg)   LMP  (LMP Unknown)   BMI 68.59 kg/m   Constitutional:  Alert and oriented, No acute distress.  In wheelchair.  Accompanied by daughter. HEENT: [place] AT, moist mucus membranes.  Trachea midline, no masses. Cardiovascular: No clubbing, cyanosis, or edema. Respiratory: Normal respiratory effort, no increased work of breathing. Abdomen: Morbidly obese Skin: No rashes, bruises or suspicious lesions. Neurologic: Grossly intact, no focal deficits, moving all 4 extremities. Psychiatric: Normal mood and affect.  Laboratory Data: Lab Results  Component Value Date   CREATININE 0.80 02/14/2020   WBC 12.111 1621  Urinalysis UA today grossly positive with greater than 30 white blood cells, greater than 30 red blood cells, many bacteria, nitrate negative  Pertinent Imaging:  CLINICAL DATA:  Right lower quadrant pain for 2 days.  EXAM: CT ABDOMEN AND PELVIS WITH CONTRAST  TECHNIQUE: Multidetector CT imaging of the abdomen and pelvis was performed using the standard protocol following bolus administration of intravenous contrast.  CONTRAST:  OMNIPAQUE IOHEXOL 300 MG/ML  SOLN  COMPARISON:  None.  FINDINGS: Lower Chest: Mild bibasilar subsegmental atelectasis versus  scarring.  Hepatobiliary: No hepatic masses identified. Prior cholecystectomy. No evidence of biliary obstruction.  Pancreas:  No mass or inflammatory changes.  Spleen: Within normal limits in size and appearance.  Adrenals/Urinary Tract: No masses identified. 8 mm calculus noted in the interpolar region of the right kidney. Mild right hydroureteronephrosis is seen due to a 3 mm calculus in the distal right ureter.  Stomach/Bowel: Postop changes from previous sleeve gastrectomy noted. No evidence of obstruction, inflammatory process or abnormal fluid collections. Normal appendix visualized.  Vascular/Lymphatic: No pathologically enlarged lymph nodes. No abdominal aortic aneurysm.  Reproductive:  No mass or other significant abnormality.  Other:  None.  Musculoskeletal:  No suspicious bone lesions identified.  IMPRESSION: Mild right hydroureteronephrosis due to 3 mm distal right ureteral calculus.  8 mm right renal calculus.   Electronically Signed  By: Danae Orleans M.D.   On: 02/14/2020 16:45  CT scan was personally reviewed.  Agree with radiologic interpretation.  Assessment & Plan:    1. Right ureteral calculus 3 mm right distal ureteral calculus, last episode of pain 5 days ago  We discussed that given the size and location of the stone, she has an excellent chance of passing this spontaneously and likely already did given absence of pain  Given the severity of her recent pain episode, she would like to ensure that she has passed the stone.  Given that its relatively small, will likely not be seen on KUB.  Would prefer not to reimage with CT.  We discussed the option of renal ultrasound to assess for resolution of her right hydroureteronephrosis to which she was agreeable.  We will call her with these results.  We discussed warning symptoms and indications for more urgent/emergent evaluation including severely poorly controlled pain as well as  fevers. - Urinalysis, Complete - Ultrasound renal complete; Future - DG Abd 1 View; Future - CULTURE, URINE COMPREHENSIVE  2. Right kidney stone 8 mm right nonobstructing stone  Given her habitus as well as comorbidities, she is not a good surgical candidate.  As such, I would recommend observation for this.  We will follow up in a year for surveillance imaging with a KUB.  We discussed general stone prevention techniques including drinking plenty water with goal of producing 2.5 L urine daily, increased citric acid intake, avoidance of high oxalate containing foods, and decreased salt intake.  Information about dietary recommendations given today.  3. Costochondritis Constant soreness in her sternum exacerbated with activity, suspect costochondritis from recent dry heaving episodes  She would like to be seen by her primary care physician.  We did give him a courtesy call to arrange for further evaluation.  4. Acute cystitis without hematuria UA today is frankly positive possible collection technique contamination however she has been able to provide negative urinalysis in the past  We will go and treat her presumptively for UTI with Keflex for 5 days.  This was sent to her pharmacy.  Follow-up urine culture.   1 year with KUB (call with RUS results)   Vanna Scotland, MD  [facility] [address], [address] [place], [place] [address] [phone]   I spent 60 total minutes on the day of the encounter including pre-visit review of the medical record, face-to-face time with the patient, and post visit ordering of labs/imaging/tests.

## 2020-03-08 LAB — CULTURE, URINE COMPREHENSIVE

## 2020-03-20 ENCOUNTER — Other Ambulatory Visit: Payer: Self-pay | Admitting: Internal Medicine

## 2020-03-20 ENCOUNTER — Other Ambulatory Visit (HOSPITAL_COMMUNITY): Payer: Self-pay | Admitting: Internal Medicine

## 2020-03-20 DIAGNOSIS — R0602 Shortness of breath: Secondary | ICD-10-CM

## 2020-03-20 DIAGNOSIS — R0781 Pleurodynia: Secondary | ICD-10-CM

## 2020-03-26 ENCOUNTER — Ambulatory Visit: Admission: RE | Admit: 2020-03-26 | Payer: 59 | Source: Ambulatory Visit

## 2020-04-03 ENCOUNTER — Ambulatory Visit
Admission: RE | Admit: 2020-04-03 | Discharge: 2020-04-03 | Disposition: A | Discharge status: Home / Self Care | Payer: 59 | Source: Ambulatory Visit | Attending: Urology | Admitting: Urology

## 2020-04-03 ENCOUNTER — Other Ambulatory Visit: Payer: Self-pay

## 2020-04-03 DIAGNOSIS — N201 Calculus of ureter: Secondary | ICD-10-CM | POA: Diagnosis present

## 2020-04-04 ENCOUNTER — Telehealth: Payer: Self-pay | Admitting: *Deleted

## 2020-04-04 NOTE — Telephone Encounter (Addendum)
Patient advised, voiced understanding .   ----- Message from Vanna Scotland, MD sent at 04/04/2020  2:37 PM EST ----- Renal ultrasound looks good.  No further swelling which indicates that you passed the stone in your ureter.  We will see you next year as scheduled.  Vanna Scotland, MD

## 2020-04-22 ENCOUNTER — Other Ambulatory Visit: Payer: Self-pay

## 2020-04-22 ENCOUNTER — Ambulatory Visit
Admission: RE | Admit: 2020-04-22 | Discharge: 2020-04-22 | Disposition: A | Discharge status: Home / Self Care | Payer: 59 | Source: Ambulatory Visit | Attending: Internal Medicine | Admitting: Internal Medicine

## 2020-04-22 DIAGNOSIS — R0781 Pleurodynia: Secondary | ICD-10-CM | POA: Insufficient documentation

## 2020-04-22 DIAGNOSIS — R0602 Shortness of breath: Secondary | ICD-10-CM | POA: Diagnosis present

## 2020-04-24 ENCOUNTER — Other Ambulatory Visit: Payer: Self-pay | Admitting: Internal Medicine

## 2020-04-24 DIAGNOSIS — M899 Disorder of bone, unspecified: Secondary | ICD-10-CM

## 2020-07-02 NOTE — Progress Notes (Signed)
07/03/2020 8:22 PM   Claudia Hoffman 01/01/1966 [id number]  Referring provider: Idelle Crouch, MD [place] [facility] [place],  [place] [address]  Chief Complaint  Patient presents with  . Nephrolithiasis   Urological history: 1. Nephrolithiasis -RUS 03/2020 9 mm right upper pole stone   HPI: Claudia Hoffman is a 55 y.o. female who presents today for kidney stones with her daughter, Merrilyn Puma.     She has been experiencing right sided pain with gross hematuria and nausea that started over the weekend.  She has not passed any fragments.     Patient denies any modifying or aggravating factors.  Patient denies any gross hematuria, dysuria or suprapubic/flank pain.  Patient denies any fevers, chills, nausea or vomiting.   KUB no stones seen.    PMH: Past Medical History:  Diagnosis Date  . Allergy   . Anemia   . Arthritis    osteo-knees, shoulders  . Asthma    well controlled  . Atopic dermatitis   . COVID   . Family history of adverse reaction to anesthesia    mom-nauseated after surgery  . Headache    migraines-1-2 per year  . Hypertension   . O2 dependent   . Obesity, Class III, BMI 40-49.9 (morbid obesity) ([place]) 11/2012   s/p bariatric sleeve surgery by Dr. Darnell Level   . Sleep apnea    no cpap    Surgical History: Past Surgical History:  Procedure Laterality Date  . BAND HEMORRHOIDECTOMY  06/2016   DONE IN DR [facility] OFFICE  . CESAREAN SECTION  1997  . CHOLECYSTECTOMY  05/2012  . GASTRIC BYPASS  11/2012   sleeve  . IUD REMOVAL    . SPHINCTEROTOMY N/A 09/25/2016   Procedure: [place] INTERNAL;  Surgeon: Leonie Green, MD;  Location: [facility] ORS;  Service: General;  Laterality: N/A;    Home Medications:  Allergies as of 07/03/2020      Reactions   Other Other (See Comments)   LETTUCE, BEEF AND VANILLA CAUSE SNEEZING-DENIES ANY SWELLING OR SOB      Medication List       Accurate as of July 03, 2020 11:59 PM. If you  have any questions, ask your nurse or doctor.        albuterol 108 (90 Base) MCG/ACT inhaler Commonly known as: VENTOLIN HFA Inhale 2 puffs into the lungs every 6 (six) hours as needed for wheezing or shortness of breath.   amitriptyline 50 MG tablet Commonly known as: ELAVIL Take 50 mg by mouth every morning.   Eliquis 2.5 MG Tabs tablet Generic drug: apixaban Take 2.5 mg by mouth 2 (two) times daily.   losartan-hydrochlorothiazide 100-12.5 MG tablet Commonly known as: HYZAAR Take 1 tablet by mouth daily.   methylphenidate 54 MG CR tablet Commonly known as: CONCERTA Take 54 mg by mouth every morning.   ondansetron 4 MG tablet Commonly known as: Zofran Take 1 tablet (4 mg total) by mouth every 8 (eight) hours as needed for nausea or vomiting. Started by: Zara Council, PA-C   oxyCODONE-acetaminophen 10-325 MG tablet Commonly known as: Percocet Take 1 tablet by mouth every 4 (four) hours as needed for pain. Started by: Zara Council, PA-C   OXYGEN Inhale 2 L into the lungs.   tamsulosin 0.4 MG Caps capsule Commonly known as: FLOMAX Take 1 capsule (0.4 mg total) by mouth daily. What changed:   how much to take  when to take this Changed by: [facility]  Ruairi Stutsman, PA-C       Allergies:  Allergies  Allergen Reactions  . Other Other (See Comments)    LETTUCE, BEEF AND VANILLA CAUSE SNEEZING-DENIES ANY SWELLING OR SOB    Family History: Family History  Problem Relation Age of Onset  . Hypertension Mother   . Hypertension Father   . Heart disease Father   . Hodgkin's lymphoma Father 76  . Breast cancer Neg Hx     Social History:  reports that she has never smoked. She has never used smokeless tobacco. She reports current alcohol use. She reports that she does not use drugs.  ROS: Pertinent ROS in HPI  Physical Exam: BP 131/82   Pulse (!) 101   Ht _0  (1.575 m)   Wt (!) 375 lb (170.1 kg)   LMP  (LMP Unknown)   BMI 68.59 kg/m   Constitutional:   Well nourished. Alert and oriented, No acute distress.  HEENT: [place] AT, mask in place.  Trachea midline Cardiovascular: No clubbing, cyanosis, or edema. Respiratory: Normal respiratory effort, no increased work of breathing. Neurologic: Grossly intact, no focal deficits, moving all 4 extremities. Psychiatric: Normal mood and affect.   Laboratory Data:  Ref Range & Units 1 mo ago  WBC (White Blood Cell Count) 4.1 - 10.2 10^3/uL 11.3High   RBC (Red Blood Cell Count) 4.04 - 5.48 10^6/uL 4.54   Hemoglobin 12.0 - 15.0 gm/dL 12.1   Hematocrit 35.0 - 47.0 % 39.6   MCV (Mean Corpuscular Volume) 80.0 - 100.0 fl 87.2   MCH (Mean Corpuscular Hemoglobin) 27.0 - 31.2 pg 26.7Low   MCHC (Mean Corpuscular Hemoglobin Concentration) 32.0 - 36.0 gm/dL 30.6Low   Platelet Count 150 - 450 10^3/uL 260   RDW-CV (Red Cell Distribution Width) 11.6 - 14.8 % 16.5High   MPV (Mean Platelet Volume) 9.4 - 12.4 fl 11.4   Neutrophils 1.50 - 7.80 10^3/uL 8.36High   Lymphocytes 1.00 - 3.60 10^3/uL 2.29   Monocytes 0.00 - 1.50 10^3/uL 0.38   Eosinophils 0.00 - 0.55 10^3/uL 0.18   Basophils 0.00 - 0.09 10^3/uL 0.05   Neutrophil % 32.0 - 70.0 % 73.9High   Lymphocyte % 10.0 - 50.0 % 20.3   Monocyte % 4.0 - 13.0 % 3.4Low   Eosinophil % 1.0 - 5.0 % 1.6   Basophil% 0.0 - 2.0 % 0.4   Immature Granulocyte % <=0.7 % 0.4   Immature Granulocyte Count <=0.06 10^3/L 0.04   Resulting [place] - LAB  Specimen Collected: 05/30/20 17:02 Last Resulted: 05/30/20 17:28  Received From: [facility]  Result Received: 07/02/20 11:19    Ref Range & Units 1 mo ago  Color Yellow, Violet, Light Violet, Dark Violet Yellow   Clarity Clear CloudyAbnormal   Specific Gravity 1.000 - 1.030 >=1.030   pH, Urine 5.0 - 8.0 5.5   Protein, Urinalysis Negative, Trace mg/dL Trace   Glucose, Urinalysis Negative mg/dL Negative   Ketones, Urinalysis Negative mg/dL Negative   Blood, Urinalysis Negative  LargeAbnormal   Nitrite, Urinalysis Negative Negative   Leukocyte Esterase, Urinalysis Negative Negative   White Blood Cells, Urinalysis None Seen, 0-3 /hpf None Seen   Red Blood Cells, Urinalysis None Seen, 0-3 /hpf >50Abnormal   Bacteria, Urinalysis None Seen /hpf FewAbnormal   Squamous Epithelial Cells, Urinalysis Rare, Few, None Seen /hpf Few   Resulting Agency  [facility] - LAB  Specimen Collected: 05/30/20 17:02 Last Resulted: 05/30/20 18:10  Received From: [facility]  Result Received: 07/02/20 11:19    Ref Range & Units 2 mo ago  Glucose 70 - 110 mg/dL 76   Sodium 136 - 145 mmol/L 141   Potassium 3.6 - 5.1 mmol/L 3.9   Chloride 97 - 109 mmol/L 103   Carbon Dioxide (CO2) 22.0 - 32.0 mmol/L 29.2   Urea Nitrogen (BUN) 7 - 25 mg/dL 17   Creatinine 0.6 - 1.1 mg/dL 0.8   Glomerular Filtration Rate (eGFR), MDRD Estimate >60 mL/min/1.73sq m 74   Calcium 8.7 - 10.3 mg/dL 9.3   AST  8 - 39 U/L 13   ALT  5 - 38 U/L 12   Alk Phos (alkaline Phosphatase) 34 - 104 U/L 126High   Albumin 3.5 - 4.8 g/dL 4.1   Bilirubin, Total 0.3 - 1.2 mg/dL 0.6   Protein, Total 6.1 - 7.9 g/dL 7.3   A/G Ratio 1.0 - 5.0 gm/dL 1.3   Resulting Agency  [place] - LAB  Specimen Collected: 04/29/20 14:08 Last Resulted: 04/29/20 16:00  Received From: [facility]  Result Received: 07/02/20 11:19  I have reviewed the labs.   Pertinent Imaging: CLINICAL DATA:  Kidney stones, right-sided pain  EXAM: ABDOMEN - 1 VIEW  COMPARISON:  None.  FINDINGS: No definite urinary tract calculi identified. Bowel gas pattern is unremarkable. No acute osseous abnormality.  IMPRESSION: No definite urinary tract calculi identified.   Electronically Signed   By: Macy Mis M.D.   On: 07/05/2020 09:33 I have independently reviewed the films.    Assessment & Plan:    1. Nephrolithiasis -CT renal stone pending   2. Gross hematuria -likely  secondary to stone -unable to provide UA at this time as lab services not available at the time of the appointment -will get UA on return  Return for return for CT renal stone report.  These notes generated with voice recognition software. I apologize for typographical errors.  Zara Council, PA-C  [facility] [address]  [place], [place] [address] [phone]

## 2020-07-03 ENCOUNTER — Ambulatory Visit (INDEPENDENT_AMBULATORY_CARE_PROVIDER_SITE_OTHER): Payer: 59 | Admitting: Urology

## 2020-07-03 ENCOUNTER — Ambulatory Visit
Admission: RE | Admit: 2020-07-03 | Discharge: 2020-07-03 | Disposition: A | Discharge status: Home / Self Care | Payer: 59 | Source: Ambulatory Visit | Attending: Urology | Admitting: Urology

## 2020-07-03 ENCOUNTER — Encounter: Payer: Self-pay | Admitting: Urology

## 2020-07-03 ENCOUNTER — Ambulatory Visit
Admission: RE | Admit: 2020-07-03 | Discharge: 2020-07-03 | Disposition: A | Discharge status: Home / Self Care | Payer: 59 | Attending: Urology | Admitting: Urology

## 2020-07-03 ENCOUNTER — Other Ambulatory Visit: Payer: Self-pay

## 2020-07-03 VITALS — BP 131/82 | HR 101 | Ht 62.0 in | Wt 375.0 lb

## 2020-07-03 DIAGNOSIS — N200 Calculus of kidney: Secondary | ICD-10-CM | POA: Insufficient documentation

## 2020-07-03 DIAGNOSIS — R310 Gross hematuria: Secondary | ICD-10-CM | POA: Diagnosis not present

## 2020-07-03 DIAGNOSIS — N201 Calculus of ureter: Secondary | ICD-10-CM

## 2020-07-03 MED ORDER — ONDANSETRON HCL 4 MG PO TABS: 4.0000 mg | ORAL_TABLET | Freq: Three times a day (TID) | ORAL | 0 refills | Status: DC | PRN

## 2020-07-03 MED ORDER — TAMSULOSIN HCL 0.4 MG PO CAPS: 0.4000 mg | ORAL_CAPSULE | Freq: Every day | ORAL | 3 refills | Status: DC

## 2020-07-03 MED ORDER — OXYCODONE-ACETAMINOPHEN 10-325 MG PO TABS: 1.0000 | ORAL_TABLET | ORAL | 0 refills | Status: DC | PRN

## 2020-07-29 ENCOUNTER — Ambulatory Visit: Payer: 59

## 2020-07-31 ENCOUNTER — Ambulatory Visit
Admission: RE | Admit: 2020-07-31 | Discharge: 2020-07-31 | Disposition: A | Discharge status: Home / Self Care | Payer: 59 | Source: Ambulatory Visit | Attending: Urology | Admitting: Urology

## 2020-07-31 ENCOUNTER — Other Ambulatory Visit: Payer: Self-pay

## 2020-07-31 DIAGNOSIS — R310 Gross hematuria: Secondary | ICD-10-CM | POA: Diagnosis present

## 2020-07-31 DIAGNOSIS — N200 Calculus of kidney: Secondary | ICD-10-CM

## 2020-08-01 ENCOUNTER — Other Ambulatory Visit: Payer: Self-pay | Admitting: *Deleted

## 2020-08-01 DIAGNOSIS — N200 Calculus of kidney: Secondary | ICD-10-CM

## 2020-08-06 ENCOUNTER — Other Ambulatory Visit: Payer: Self-pay

## 2020-08-09 ENCOUNTER — Encounter: Payer: Self-pay | Admitting: Urology

## 2020-08-27 ENCOUNTER — Telehealth: Payer: Self-pay

## 2020-08-27 NOTE — Telephone Encounter (Signed)
-----   Message from Harle Battiest, PA-C sent at 08/27/2020 11:09 AM EDT ----- Will you check and see if Mrs. Moro has come for an ua and urine cutlure?

## 2020-08-27 NOTE — Telephone Encounter (Signed)
Spoke with patient she was added on to the lab schedule 09/02/20 for UA drop off hematuria recheck

## 2020-09-02 ENCOUNTER — Other Ambulatory Visit: Payer: Self-pay

## 2020-09-17 ENCOUNTER — Telehealth: Payer: Self-pay | Admitting: Urology

## 2020-09-17 NOTE — Telephone Encounter (Signed)
Spoke with patient and she states she's not having any symptoms of a UTI and not seeing blood. Pt also states her PCP checks her blood work and urine. Pt states she was told to take Flomax and that she pass the stone and she has. 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

## 2020-09-17 NOTE — Telephone Encounter (Signed)
Would you please call Claudia Hoffman and remind her that we still need to have her drop off an urine for urinalysis and urine culture?  She had microscopic hematuria on her urinalysis prior and CT scan had findings suspicious for a urinary tract infection along with the small distal stone.

## 2021-03-11 ENCOUNTER — Ambulatory Visit: Payer: Self-pay | Admitting: Urology

## 2021-03-17 ENCOUNTER — Encounter: Payer: Self-pay | Admitting: Obstetrics and Gynecology

## 2021-03-17 ENCOUNTER — Other Ambulatory Visit (HOSPITAL_COMMUNITY)
Admission: RE | Admit: 2021-03-17 | Discharge: 2021-03-17 | Disposition: A | Discharge status: Home / Self Care | Payer: 59 | Source: Ambulatory Visit | Attending: Obstetrics and Gynecology | Admitting: Obstetrics and Gynecology

## 2021-03-17 ENCOUNTER — Other Ambulatory Visit: Payer: Self-pay

## 2021-03-17 ENCOUNTER — Ambulatory Visit (INDEPENDENT_AMBULATORY_CARE_PROVIDER_SITE_OTHER): Payer: 59 | Admitting: Obstetrics and Gynecology

## 2021-03-17 VITALS — Ht 62.0 in | Wt 376.5 lb

## 2021-03-17 DIAGNOSIS — Z6841 Body Mass Index (BMI) 40.0 and over, adult: Secondary | ICD-10-CM

## 2021-03-17 DIAGNOSIS — I10 Essential (primary) hypertension: Secondary | ICD-10-CM

## 2021-03-17 DIAGNOSIS — N898 Other specified noninflammatory disorders of vagina: Secondary | ICD-10-CM

## 2021-03-17 DIAGNOSIS — Z01419 Encounter for gynecological examination (general) (routine) without abnormal findings: Secondary | ICD-10-CM | POA: Diagnosis not present

## 2021-03-17 DIAGNOSIS — Z1231 Encounter for screening mammogram for malignant neoplasm of breast: Secondary | ICD-10-CM

## 2021-03-17 DIAGNOSIS — Z124 Encounter for screening for malignant neoplasm of cervix: Secondary | ICD-10-CM

## 2021-03-17 NOTE — Progress Notes (Signed)
ANNUAL PREVENTATIVE CARE GYNECOLOGY  ENCOUNTER NOTE  Subjective:       Claudia Hoffman is a 55 y.o. G70P0010 female here for a routine annual gynecologic exam. She was last seen in 2019 by Harlow Mares, CNM. The patient is not currently sexually active. The patient has never been on hormone replacement therapy. Patient denies post-menopausal vaginal bleeding. The patient wears seatbelts: yes. The patient participates in regular exercise: no. Has the patient ever been transfused or tattooed?: no. The patient reports that there is not domestic violence in her life.  Current complaints: 1.  Reports h/o COVID in 2020, followed by multiple issues (specifically lung complications).  Just now getting to the point where she no longer requires O2. Sees her PCP q 4-6 weeks.  2. Feels like there is a vaginal bump inside of her vagina. Has been there for several months.    Gynecologic History No LMP recorded (lmp unknown). Patient is postmenopausal. Contraception: post menopausal status Last Pap: 08/19/2016. Results were: normal. Denies h/o abnormal pap smears.  Last mammogram: 12/25/2019. Results were: normal Last Colonoscopy: patient has never had one Last Dexa Scan: patient has never had one   Obstetric History OB History  Gravida Para Term Preterm AB Living  1 0     1    SAB IAB Ectopic Multiple Live Births               # Outcome Date GA Lbr Len/2nd Weight Sex Delivery Anes PTL Lv  1 AB             Past Medical History:  Diagnosis Date   Allergy    Anemia    Arthritis    osteo-knees, shoulders   Asthma    well controlled   Atopic dermatitis    COVID    Family history of adverse reaction to anesthesia    mom-nauseated after surgery   Headache    migraines-1-2 per year   Hypertension    O2 dependent    Obesity, Class III, BMI 40-49.9 (morbid obesity) (HCC) 11/2012   s/p bariatric sleeve surgery by Dr. Smitty Cords    Sleep apnea    no cpap    Family History  Problem Relation  Age of Onset   Hypertension Mother    Hypertension Father    Heart disease Father    Hodgkin's lymphoma Father 67   Breast cancer Neg Hx     Past Surgical History:  Procedure Laterality Date   BAND HEMORRHOIDECTOMY  06/2016   DONE IN DR [facility] OFFICE   CESAREAN SECTION  1997   CHOLECYSTECTOMY  05/2012   GASTRIC BYPASS  11/2012   sleeve   IUD REMOVAL     SPHINCTEROTOMY N/A 09/25/2016   Procedure: Tyrone Nine INTERNAL;  Surgeon: Nadeen Landau, MD;  Location: [facility] ORS;  Service: General;  Laterality: N/A;    Social History   Socioeconomic History   Marital status: Divorced    Spouse name: Not on file   Number of children: 1   Years of education: 14   Highest education level: Associate degree: occupational, Scientist, product/process development, or vocational program  Occupational History   Occupation: stylist  Tobacco Use   Smoking status: Never   Smokeless tobacco: Never  Vaping Use   Vaping Use: Never used  Substance and Sexual Activity   Alcohol use: Yes    Alcohol/week: 0.0 standard drinks    Comment: rare currently-was a moderate alcohol user, but has not used in 81yrs  Drug use: No   Sexual activity: Not Currently    Partners: Male  Other Topics Concern   Not on file  Social History Narrative   Not on file   Social Determinants of Health   Financial Resource Strain: Not on file  Food Insecurity: Not on file  Transportation Needs: Not on file  Physical Activity: Not on file  Stress: Not on file  Social Connections: Not on file  Intimate Partner Violence: Not on file    Current Outpatient Medications on File Prior to Visit  Medication Sig Dispense Refill   albuterol (PROVENTIL HFA;VENTOLIN HFA) 108 (90 Base) MCG/ACT inhaler Inhale 2 puffs into the lungs every 6 (six) hours as needed for wheezing or shortness of breath.     amitriptyline (ELAVIL) 50 MG tablet Take 50 mg by mouth every morning.   1   ELIQUIS 2.5 MG TABS tablet Take 2.5 mg by mouth 2 (two) times daily.      HYDROcodone-acetaminophen (NORCO) 10-325 MG tablet Take 1 tablet by mouth every 6 (six) hours as needed.     losartan-hydrochlorothiazide (HYZAAR) 100-12.5 MG tablet Take 1 tablet by mouth daily.  1   methylphenidate 54 MG PO CR tablet Take 54 mg by mouth every morning.     tamsulosin (FLOMAX) 0.4 MG CAPS capsule Take 1 capsule (0.4 mg total) by mouth daily. 90 capsule 3   ondansetron (ZOFRAN) 4 MG tablet Take 1 tablet (4 mg total) by mouth every 8 (eight) hours as needed for nausea or vomiting. (Patient not taking: Reported on 03/17/2021) 20 tablet 0   oxyCODONE-acetaminophen (PERCOCET) 10-325 MG tablet Take 1 tablet by mouth every 4 (four) hours as needed for pain. (Patient not taking: Reported on 03/17/2021) 10 tablet 0   OXYGEN Inhale 2 L into the lungs. (Patient not taking: Reported on 03/17/2021)     No current facility-administered medications on file prior to visit.    Allergies  Allergen Reactions   Other Other (See Comments)    LETTUCE, BEEF AND VANILLA CAUSE SNEEZING-DENIES ANY SWELLING OR SOB      Review of Systems ROS Review of Systems - General ROS: negative for - chills, fatigue, fever, hot flashes, night sweats, weight gain or weight loss Psychological ROS: negative for - anxiety, decreased libido, depression, mood swings, physical abuse or sexual abuse Ophthalmic ROS: negative for - blurry vision, eye pain or loss of vision ENT ROS: negative for - headaches, hearing change, visual changes or vocal changes Allergy and Immunology ROS: negative for - hives, itchy/watery eyes or seasonal allergies Hematological and Lymphatic ROS: negative for - bleeding problems, bruising, swollen lymph nodes or weight loss Endocrine ROS: negative for - galactorrhea, hair pattern changes, hot flashes, malaise/lethargy, mood swings, palpitations, polydipsia/polyuria, skin changes, temperature intolerance or unexpected weight changes Breast ROS: negative for - new or changing breast lumps  or nipple discharge Respiratory ROS: negative for - cough or shortness of breath Cardiovascular ROS: negative for - chest pain, irregular heartbeat, palpitations or shortness of breath Gastrointestinal ROS: no abdominal pain, change in bowel habits, or black or bloody stools Genito-Urinary ROS: no dysuria, trouble voiding, or hematuria. Positive for vaginal bump Musculoskeletal ROS: negative for - joint pain or joint stiffness Neurological ROS: negative for - bowel and bladder control changes Dermatological ROS: negative for rash and skin lesion changes   Objective:   Ht 5\' 2"  (1.575 m)    Wt (!) 376 lb 8 oz (170.8 kg)    LMP  (LMP Unknown)  BMI 68.86 kg/m  Unable to obtain BP due to inadequate cuff size.  CONSTITUTIONAL: Well-developed, well-nourished female in no acute distress.  PSYCHIATRIC: Normal mood and affect. Normal behavior. Normal judgment and thought content. [place]: Alert and oriented to person, place, and time. Normal muscle tone coordination. No cranial nerve deficit noted. HENT:  Normocephalic, atraumatic, External right and left ear normal. Oropharynx is clear and moist EYES: Conjunctivae and EOM are normal. Pupils are equal, round, and reactive to light. No scleral icterus.  NECK: Normal range of motion, supple, no masses.  Normal thyroid.  SKIN: Skin is warm and dry. No rash noted. Not diaphoretic. No erythema. No pallor. CARDIOVASCULAR: Normal heart rate noted, regular rhythm, no murmur. RESPIRATORY: Clear to auscultation bilaterally. Effort and breath sounds normal, no problems with respiration noted. BREASTS: Symmetric in size. No masses, skin changes, nipple drainage, or lymphadenopathy. ABDOMEN: Soft, normal bowel sounds, no distention noted.  No tenderness, rebound or guarding.  BLADDER: Normal PELVIC:  Bladder no bladder distension noted  Urethra: normal appearing urethra with no masses, tenderness or lesions  Vulva: normal appearing vulva with no masses,  tenderness or lesions  Vagina: normal appearing vagina with normal color and discharge, no lesions. Small left vaginal wall cyst beneath mucosa, ~ 3-4 mm.   Cervix: not indicated and difficult to visualize due to redundant vaginal tissue and long cervical canal.   Uterus:difficult to palpate due to obesity but no enlargement or masses palpable.   Adnexa: normal adnexa in size, nontender and no masses  RV: External Exam NormaI and No Rectal Masses  MUSCULOSKELETAL: Normal range of motion. No tenderness.  No cyanosis, clubbing, or edema.  2+ distal pulses. LYMPHATIC: No Axillary, Supraclavicular, or Inguinal Adenopathy.   Labs: Reviwed in Care Everywhere Assessment:   1. Well woman exam with routine gynecological exam   2. Breast cancer screening by mammogram   3. Essential hypertension   4. Morbid obesity with BMI of 60.0-69.9, adult ([place])   5. Vaginal inclusion cyst   6. Cervical cancer screening     Plan:  Pap: Pap Co Test performed (blind pap due to difficulty visualizing cervix).  Mammogram: Ordered Colon Screening:   discussed screening options Labs:  None ordered.  Labs per PCP, reviewed in Care Everywhere.  Routine preventative health maintenance measures emphasized: Exercise/Diet/Weight control, Tobacco Warnings, Alcohol/Substance use risks, Stress Management, and Safe Sex COVID Vaccination status: Has completed 2 dose series but has not received boosters due to medical issues.  Flu vaccine: up to date.  Discussed vaginal inclusion cyst is benign, no intervention needed.  Essential HTN managed by PCP.  Return to [place], [place]

## 2021-03-19 LAB — CYTOLOGY - PAP
Adequacy: ABSENT
Comment: NEGATIVE
Diagnosis: NEGATIVE
High risk HPV: NEGATIVE

## 2021-07-09 ENCOUNTER — Encounter: Payer: Self-pay | Admitting: Podiatry

## 2021-07-09 ENCOUNTER — Ambulatory Visit: Payer: 59 | Admitting: Podiatry

## 2021-07-09 DIAGNOSIS — M778 Other enthesopathies, not elsewhere classified: Secondary | ICD-10-CM

## 2021-07-09 DIAGNOSIS — M722 Plantar fascial fibromatosis: Secondary | ICD-10-CM | POA: Diagnosis not present

## 2021-07-09 MED ORDER — TRIAMCINOLONE ACETONIDE 10 MG/ML IJ SUSP
20.0000 mg | Freq: Once | INTRAMUSCULAR | Status: AC
  Administered 2021-07-09: 20 mg

## 2021-07-10 NOTE — Progress Notes (Signed)
Subjective:  ? ?Patient ID: Claudia Hoffman, female   DOB: 56 y.o.   MRN: [id number]  ? ?HPI ?Patient states she is getting pain on the bottom of her left foot and on the top of her left foot and that both of them are bothering her and making it hard to walk.  Also thinks she may have a foreign body in the plantar aspect left foot.  She did get COVID and has had issues with breathing and does have obesity is complicating factor ? ? ?ROS ? ? ?   ?Objective:  ?Physical Exam  ?Neurovascular status intact with inflammation of the lateral band of the plantar fascia left within the mid arch area and then on top is noted to have extensor tendinitis with inflammation occurring mostly over the lateral midfoot with possibility for low-grade midgrade arthritis.  I also noted a lesion plantar aspect left that does have a lucent core ? ?   ?Assessment:  ?Planter fasciitis left with possibility for small foreign body or porokeratotic lesion and arthritis with moderate lateral extensor tendinitis left ? ?   ?Plan:  ?H&P reviewed both conditions and for the dorsum I did go ahead did sterile prep injected the lateral complex 3 mg Kenalog dexamethasone 5 mg Xylocaine and for the plantar lateral I did inject the complex 3 mg Kenalog 5 mg Xylocaine and then went ahead and debrided the lesion did not note foreign bodies on hoping this will be the end of the issues.  Reappoint as symptoms indicate ?   ? ? ?

## 2021-07-16 ENCOUNTER — Ambulatory Visit: Payer: 59 | Admitting: Podiatry

## 2021-12-17 ENCOUNTER — Ambulatory Visit: Payer: 59 | Admitting: Podiatry

## 2021-12-17 ENCOUNTER — Encounter: Payer: Self-pay | Admitting: Podiatry

## 2021-12-17 DIAGNOSIS — M778 Other enthesopathies, not elsewhere classified: Secondary | ICD-10-CM

## 2021-12-17 MED ORDER — TRIAMCINOLONE ACETONIDE 10 MG/ML IJ SUSP
10.0000 mg | Freq: Once | INTRAMUSCULAR | Status: AC
  Administered 2021-12-17: 10 mg

## 2021-12-18 NOTE — Progress Notes (Signed)
Subjective:   Patient ID: Claudia Hoffman, female   DOB: 56 y.o.   MRN: [id number]   HPI Patient states she has a lot of pain in the outside of the left midfoot and states there is also a lesion which seems to be present and is painful.  Obesity is complicating factor    ROS      Objective:  Physical Exam  Neurovascular status found to be unchanged with inflammation of the base of the fifth metatarsal left with fluid buildup and also a lucent type lesion in the area     Assessment:  Inflammatory capsulitis base of fifth MPJ left along with porokeratotic lesion     Plan:  Sterile prep and went ahead today and did inject the plantar capsule 3 mg dexamethasone Kenalog and did courtesy debridement of the lesion.  Applied cushioning to the area and patient will be seen back as symptoms indicate

## 2022-03-18 NOTE — Progress Notes (Unsigned)
ANNUAL PREVENTATIVE CARE GYNECOLOGY  ENCOUNTER NOTE  Subjective:       Claudia Hoffman is a 56 y.o. G68P0010 female here for a routine annual gynecologic exam. The patient is sexually active. The patient is not taking hormone replacement therapy. Patient denies post-menopausal vaginal bleeding. The patient wears seatbelts: yes. The patient participates in regular exercise: yes. Has the patient ever been transfused or tattooed?: yes. The patient reports that there is not domestic violence in her life.  Current complaints: 1. Reports some external vulvar itching near clitoral region and upper portions of her labia.  This has been ongoing for several weeks.  Notes that she does has tried using a cream that her PCP prescribed for her, as well as OTC Monistat but has not helped.    Gynecologic History No LMP recorded (lmp unknown). Patient is postmenopausal. Contraception: post menopausal status Last Pap: 03/17/2021. Results were: normal Last mammogram: 12/25/2019. Results were: normal Last Colonoscopy: Never done. Receives yearly FOBT testing with PCP.     Obstetric History OB History  Gravida Para Term Preterm AB Living  1 0     1    SAB IAB Ectopic Multiple Live Births               # Outcome Date GA Lbr Len/2nd Weight Sex Delivery Anes PTL Lv  1 AB             Past Medical History:  Diagnosis Date   Allergy    Anemia    Arthritis    osteo-knees, shoulders   Asthma    well controlled   Atopic dermatitis    COVID    Family history of adverse reaction to anesthesia    mom-nauseated after surgery   Headache    migraines-1-2 per year   Hypertension    O2 dependent    Obesity, Class III, BMI 40-49.9 (morbid obesity) (HCC) 11/2012   s/p bariatric sleeve surgery by Dr. Smitty Cords    Sleep apnea    no cpap    Family History  Problem Relation Age of Onset   Hypertension Mother    Hypertension Father    Heart disease Father    Hodgkin's lymphoma Father 64   Breast cancer Neg  Hx     Past Surgical History:  Procedure Laterality Date   BAND HEMORRHOIDECTOMY  06/2016   DONE IN DR [facility] OFFICE   CESAREAN SECTION  1997   CHOLECYSTECTOMY  05/2012   GASTRIC BYPASS  11/2012   sleeve   IUD REMOVAL     SPHINCTEROTOMY N/A 09/25/2016   Procedure: Tyrone Nine INTERNAL;  Surgeon: Nadeen Landau, MD;  Location: [facility] ORS;  Service: General;  Laterality: N/A;    Social History   Socioeconomic History   Marital status: Divorced    Spouse name: Not on file   Number of children: 1   Years of education: 14   Highest education level: Associate degree: occupational, Scientist, product/process development, or vocational program  Occupational History   Occupation: stylist  Tobacco Use   Smoking status: Never   Smokeless tobacco: Never  Vaping Use   Vaping Use: Never used  Substance and Sexual Activity   Alcohol use: Yes    Alcohol/week: 0.0 standard drinks of alcohol    Comment: rare currently-was a moderate alcohol user, but has not used in 48yrs   Drug use: No   Sexual activity: Not Currently    Partners: Male  Other Topics Concern   Not on file  Social History Narrative   Not on file   Social Determinants of Health   Financial Resource Strain: Low Risk  (09/20/2018)   Overall Financial Resource Strain (CARDIA)    Difficulty of Paying Living Expenses: Not hard at all  Food Insecurity: No Food Insecurity (09/20/2018)   Hunger Vital Sign    Worried About Running Out of Food in the Last Year: Never true    Ran Out of Food in the Last Year: Never true  Transportation Needs: No Transportation Needs (09/20/2018)   PRAPARE - Administrator, Civil Service (Medical): No    Lack of Transportation (Non-Medical): No  Physical Activity: Inactive (09/20/2018)   Exercise Vital Sign    Days of Exercise per Week: 0 days    Minutes of Exercise per Session: 0 min  Stress: No Stress Concern Present (09/20/2018)   [employer] of Occupational Health - Occupational Stress  Questionnaire    Feeling of Stress : Not at all  Social Connections: Moderately Isolated (09/20/2018)   Social Connection and Isolation Panel [NHANES]    Frequency of Communication with Friends and Family: Three times a week    Frequency of Social Gatherings with Friends and Family: Three times a week    Attends Religious Services: More than 4 times per year    Active Member of Clubs or Organizations: No    Attends Banker Meetings: Never    Marital Status: Divorced  Catering manager Violence: Not At Risk (09/20/2018)   Humiliation, Afraid, Rape, and Kick questionnaire    Fear of Current or Ex-Partner: No    Emotionally Abused: No    Physically Abused: No    Sexually Abused: No    Current Outpatient Medications on File Prior to Visit  Medication Sig Dispense Refill   albuterol (PROVENTIL HFA;VENTOLIN HFA) 108 (90 Base) MCG/ACT inhaler Inhale 2 puffs into the lungs every 6 (six) hours as needed for wheezing or shortness of breath.     amitriptyline (ELAVIL) 50 MG tablet Take 50 mg by mouth every morning.   1   ELIQUIS 2.5 MG TABS tablet Take 2.5 mg by mouth 2 (two) times daily.     HYDROcodone-acetaminophen (NORCO) 10-325 MG tablet Take 1 tablet by mouth every 6 (six) hours as needed.     losartan-hydrochlorothiazide (HYZAAR) 100-12.5 MG tablet Take 1 tablet by mouth daily.  1   methylphenidate 54 MG PO CR tablet Take 54 mg by mouth every morning.     tamsulosin (FLOMAX) 0.4 MG CAPS capsule Take 1 capsule (0.4 mg total) by mouth daily. 90 capsule 3   No current facility-administered medications on file prior to visit.    Allergies  Allergen Reactions   Other Other (See Comments)    LETTUCE, BEEF AND VANILLA CAUSE SNEEZING-DENIES ANY SWELLING OR SOB      Review of Systems ROS Review of Systems - General ROS: negative for - chills, fatigue, fever, hot flashes, night sweats, weight gain or weight loss Psychological ROS: negative for - anxiety, decreased libido,  depression, mood swings, physical abuse or sexual abuse Ophthalmic ROS: negative for - blurry vision, eye pain or loss of vision ENT ROS: negative for - headaches, hearing change, visual changes or vocal changes Allergy and Immunology ROS: negative for - hives, itchy/watery eyes or seasonal allergies Hematological and Lymphatic ROS: negative for - bleeding problems, bruising, swollen lymph nodes or weight loss Endocrine ROS: negative for - galactorrhea, hair pattern changes, hot flashes, malaise/lethargy, mood swings, palpitations,  polydipsia/polyuria, skin changes, temperature intolerance or unexpected weight changes Breast ROS: negative for - new or changing breast lumps or nipple discharge Respiratory ROS: negative for - cough or shortness of breath Cardiovascular ROS: negative for - chest pain, irregular heartbeat, palpitations or shortness of breath Gastrointestinal ROS: no abdominal pain, change in bowel habits, or black or bloody stools Genito-Urinary ROS: no dysuria, trouble voiding, or hematuria Musculoskeletal ROS: negative for - joint pain or joint stiffness Neurological ROS: negative for - bowel and bladder control changes Dermatological ROS: negative for rash and skin lesion changes   Objective:   BP 122/62   Pulse 73   Resp 16   Ht 5' 2.5" (1.588 m)   Wt (!) 349 lb 8 oz (158.5 kg)   LMP  (LMP Unknown)   BMI 62.91 kg/m  CONSTITUTIONAL: Well-developed, well-nourished female in no acute distress.  PSYCHIATRIC: Normal mood and affect. Normal behavior. Normal judgment and thought content. NEUROLGIC: Alert and oriented to person, place, and time. Normal muscle tone coordination. No cranial nerve deficit noted. HENT:  Normocephalic, atraumatic, External right and left ear normal. Oropharynx is clear and moist EYES: Conjunctivae and EOM are normal. Pupils are equal, round, and reactive to light. No scleral icterus.  NECK: Normal range of motion, supple, no masses.  Normal thyroid.   SKIN: Skin is warm and dry. No rash noted. Not diaphoretic. No erythema. No pallor. CARDIOVASCULAR: Normal heart rate noted, regular rhythm, no murmur. RESPIRATORY: Clear to auscultation bilaterally. Effort and breath sounds normal, no problems with respiration noted. BREASTS: Symmetric in size. No masses, skin changes, nipple drainage, or lymphadenopathy. ABDOMEN: Soft, normal bowel sounds, no distention noted.  No tenderness, rebound or guarding.  BLADDER: Normal PELVIC:  Bladder no bladder distension noted  Urethra: normal appearing urethra with no masses, tenderness or lesions  Vulva: vulva with no masses, tenderness or lesions. Dry patchy areas on upper 1/3 of labia minora bilaterally.   Vagina: normal appearing vagina with normal color and discharge. Small vaginal inclusion cyst noted.   Cervix: normal appearing cervix without discharge or lesions  Uterus: unable to assess due to body habitus  Adnexa: normal adnexa in size, nontender and no masses  RV: External Exam NormaI, No Rectal Masses, and Normal Sphincter tone  MUSCULOSKELETAL: Normal range of motion. No tenderness.  No cyanosis, clubbing, or edema.  2+ distal pulses. LYMPHATIC: No Axillary, Supraclavicular, or Inguinal Adenopathy.   Labs: Lab Results  Component Value Date   WBC 11.2 (H) 09/25/2018   HGB 11.7 (L) 09/25/2018   HCT 38.8 09/25/2018   MCV 88.8 09/25/2018   PLT 267 09/25/2018    Lab Results  Component Value Date   CREATININE 0.80 02/14/2020   BUN 39 (H) 09/26/2018   NA 142 09/26/2018   K 5.0 09/26/2018   CL 105 09/26/2018   CO2 25 09/26/2018    Lab Results  Component Value Date   ALT 22 09/26/2018   AST 15 09/26/2018   ALKPHOS 60 09/26/2018   BILITOT 0.3 09/26/2018    Lab Results  Component Value Date   CHOL 170 10/08/2014   HDL 46 10/08/2014   LDLCALC 90 10/08/2014   TRIG 138 09/20/2018   CHOLHDL 3.7 10/08/2014    Lab Results  Component Value Date   TSH 2.180 10/08/2014    Lab  Results  Component Value Date   HGBA1C 5.9 05/23/2012     Assessment:   1. Well woman exam with routine gynecological exam   2. Breast  cancer screening by mammogram   3. Need for immunization against influenza   4. Dry skin dermatitis   5. Vaginal inclusion cyst   6. Morbid obesity with BMI of 60.0-69.9, adult (HCC)      Plan:  - Pap:  UTD - Mammogram: Ordered Colon Screening:  Patient receives yearly FOBT testing with PCP. Discussed other methods of testing including Cologuard and colonoscopy. Patient may think about Cologuard for future screening and will discuss with her PCP.  - Labs:  UTD by PCP - Routine preventative health maintenance measures emphasized: Exercise/Diet/Weight control, Tobacco Warnings, Alcohol/Substance use risks, Stress Management, Peer Pressure Issues, and Safe Sex - Vaginal inclusion cyst remains stable. Still advised on further intervention at this time.  - Dry skin dermatitis, will prescribe Triamcinolone ointment, and encourage to combine with OTC Eucerin cream.  Return to Clinic - 1 Year   Hildred Laser, MD [facility]

## 2022-03-19 ENCOUNTER — Encounter: Payer: Self-pay | Admitting: Obstetrics and Gynecology

## 2022-03-19 ENCOUNTER — Ambulatory Visit: Payer: Self-pay | Admitting: Obstetrics and Gynecology

## 2022-03-19 ENCOUNTER — Ambulatory Visit (INDEPENDENT_AMBULATORY_CARE_PROVIDER_SITE_OTHER): Payer: 59 | Admitting: Obstetrics and Gynecology

## 2022-03-19 VITALS — BP 122/62 | HR 73 | Resp 16 | Ht 62.5 in | Wt 349.5 lb

## 2022-03-19 DIAGNOSIS — N898 Other specified noninflammatory disorders of vagina: Secondary | ICD-10-CM | POA: Diagnosis not present

## 2022-03-19 DIAGNOSIS — Z13220 Encounter for screening for lipoid disorders: Secondary | ICD-10-CM

## 2022-03-19 DIAGNOSIS — L853 Xerosis cutis: Secondary | ICD-10-CM | POA: Diagnosis not present

## 2022-03-19 DIAGNOSIS — Z23 Encounter for immunization: Secondary | ICD-10-CM

## 2022-03-19 DIAGNOSIS — Z131 Encounter for screening for diabetes mellitus: Secondary | ICD-10-CM

## 2022-03-19 DIAGNOSIS — Z01411 Encounter for gynecological examination (general) (routine) with abnormal findings: Secondary | ICD-10-CM

## 2022-03-19 DIAGNOSIS — Z1231 Encounter for screening mammogram for malignant neoplasm of breast: Secondary | ICD-10-CM

## 2022-03-19 DIAGNOSIS — Z6841 Body Mass Index (BMI) 40.0 and over, adult: Secondary | ICD-10-CM

## 2022-03-19 DIAGNOSIS — I10 Essential (primary) hypertension: Secondary | ICD-10-CM

## 2022-03-19 DIAGNOSIS — Z01419 Encounter for gynecological examination (general) (routine) without abnormal findings: Secondary | ICD-10-CM

## 2022-03-19 MED ORDER — TRIAMCINOLONE ACETONIDE 0.5 % EX CREA: 1.0000 | TOPICAL_CREAM | Freq: Two times a day (BID) | CUTANEOUS | 1 refills | Status: AC

## 2022-03-19 NOTE — Patient Instructions (Signed)

## 2022-05-11 ENCOUNTER — Ambulatory Visit (INDEPENDENT_AMBULATORY_CARE_PROVIDER_SITE_OTHER): Payer: 59

## 2022-05-11 ENCOUNTER — Ambulatory Visit: Payer: 59 | Admitting: Podiatry

## 2022-05-11 ENCOUNTER — Encounter: Payer: Self-pay | Admitting: Podiatry

## 2022-05-11 DIAGNOSIS — M778 Other enthesopathies, not elsewhere classified: Secondary | ICD-10-CM

## 2022-05-11 DIAGNOSIS — M722 Plantar fascial fibromatosis: Secondary | ICD-10-CM

## 2022-05-11 DIAGNOSIS — M779 Enthesopathy, unspecified: Secondary | ICD-10-CM | POA: Diagnosis not present

## 2022-05-11 MED ORDER — AMBULATORY NON FORMULARY MEDICATION: 5 refills | Status: AC

## 2022-05-11 MED ORDER — TRIAMCINOLONE ACETONIDE 10 MG/ML IJ SUSP
10.0000 mg | Freq: Once | INTRAMUSCULAR | Status: AC
  Administered 2022-05-11: 10 mg

## 2022-05-11 NOTE — Progress Notes (Signed)
Subjective:   Patient ID: Claudia Hoffman, female   DOB: 57 y.o.   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  Reappoint for [place] to recheck 3 months and hopefully we can keep her on a regular pattern  X-rays indicate with what appears to be some stress on the third metatarsal cuneiform joint

## 2022-06-01 ENCOUNTER — Telehealth: Payer: Self-pay

## 2022-06-01 NOTE — Telephone Encounter (Signed)
Patient called regarding bill for [facility] 03/19/22 w/ Dr. Marcelline Mates. Contacted customer service and was told Dr. Marcelline Mates was not accepting [place] in November and December. Guarantor acct notes indicate this has been sent for a credentialing review. Emailing Melinda Reaves in credentialing and ProFee.

## 2022-07-03 ENCOUNTER — Telehealth: Payer: Self-pay

## 2022-07-03 NOTE — Telephone Encounter (Signed)
Pt calling to speak with Harriett Sine the office manager.  [phone]  Courtesy call to pt to let her know Harriett Sine is out of the office today and back Monday.  Pt states it's about her ins/office visit with Dr. Valentino Saxon; ins is telling her that Dr. Valentino Saxon dropped off their for Nov. And Dec.  Pt states if she had known that she would not have kept her appt; now has a $400 bill that has probably been turned over to collections by now.  Wants to talk to [place].  Adv I would send msg to [place].

## 2022-07-27 ENCOUNTER — Ambulatory Visit (INDEPENDENT_AMBULATORY_CARE_PROVIDER_SITE_OTHER): Payer: 59

## 2022-07-27 ENCOUNTER — Other Ambulatory Visit (HOSPITAL_COMMUNITY)
Admission: RE | Admit: 2022-07-27 | Discharge: 2022-07-27 | Disposition: A | Discharge status: Home / Self Care | Payer: 59 | Source: Ambulatory Visit | Attending: Obstetrics and Gynecology | Admitting: Obstetrics and Gynecology

## 2022-07-27 VITALS — BP 109/73 | HR 74 | Wt 360.2 lb

## 2022-07-27 DIAGNOSIS — N898 Other specified noninflammatory disorders of vagina: Secondary | ICD-10-CM

## 2022-07-27 NOTE — Progress Notes (Signed)
    NURSE VISIT NOTE  Subjective:    Patient ID: Claudia Hoffman, female    DOB: April 03, 1965, 57 y.o.   MRN: [id number]  HPI  Patient is a 57 y.o. G65P0010 female who presents for milky vaginal discharge for 12 day(s). Denies abnormal vaginal bleeding or significant pelvic pain or fever. Patient has history of known exposure to STD 15-20 years ago. Patient states her and her partner had sex on the 17th and the condom broke, she starting itching so she had a Diflucan pill and took it on the 23rd with no relief.   Objective:    BP 109/73   Pulse 74   Wt (!) 360 lb 3.2 oz (163.4 kg)   LMP  (LMP Unknown)   BMI 64.83 kg/m    @THIS  VISIT ONLY@  Assessment:   1. Vaginal discharge       Plan:   GC and chlamydia DNA  probe sent to lab. Treatment: Can use Monistat 7 day treatment until results come back. ROV prn if symptoms persist or worsen. Please call patient with results.   Burtis Junes, CMA

## 2022-07-28 LAB — CERVICOVAGINAL ANCILLARY ONLY
Bacterial Vaginitis (gardnerella): POSITIVE — AB
Candida Glabrata: NEGATIVE
Candida Vaginitis: NEGATIVE
Chlamydia: NEGATIVE
Comment: NEGATIVE
Comment: NEGATIVE
Comment: NEGATIVE
Comment: NEGATIVE
Comment: NEGATIVE
Comment: NORMAL
Neisseria Gonorrhea: NEGATIVE
Trichomonas: POSITIVE — AB

## 2022-07-29 ENCOUNTER — Other Ambulatory Visit: Payer: Self-pay

## 2022-07-29 ENCOUNTER — Telehealth: Payer: Self-pay

## 2022-07-29 DIAGNOSIS — B9689 Other specified bacterial agents as the cause of diseases classified elsewhere: Secondary | ICD-10-CM

## 2022-07-29 DIAGNOSIS — N760 Acute vaginitis: Secondary | ICD-10-CM

## 2022-07-29 DIAGNOSIS — A599 Trichomoniasis, unspecified: Secondary | ICD-10-CM

## 2022-07-29 MED ORDER — METRONIDAZOLE 500 MG PO TABS: 500.0000 mg | ORAL_TABLET | Freq: Two times a day (BID) | ORAL | 0 refills | Status: AC

## 2022-07-29 NOTE — Progress Notes (Signed)
Rx sent in to treat BV and Trichomonas. Called pt to advise of results, no answer and voicemail was full.

## 2022-07-29 NOTE — Progress Notes (Signed)
Patient seen for Nurse visit to test for c/o vaginal discharge. +Trich and BV. She has had medication sent to pharmacy and is made aware of need for partner getting treated. Mirna Mires, CNM  07/29/2022 12:20 PM

## 2022-07-29 NOTE — Telephone Encounter (Signed)
Called and told Ellyanna her results and medication was sent in to her pharmacy. She understood

## 2022-08-12 ENCOUNTER — Ambulatory Visit: Payer: 59 | Admitting: Podiatry

## 2022-08-19 ENCOUNTER — Ambulatory Visit
Admission: RE | Admit: 2022-08-19 | Discharge: 2022-08-19 | Disposition: A | Discharge status: Home / Self Care | Payer: 59 | Source: Ambulatory Visit | Attending: Obstetrics and Gynecology | Admitting: Obstetrics and Gynecology

## 2022-08-19 DIAGNOSIS — Z01419 Encounter for gynecological examination (general) (routine) without abnormal findings: Secondary | ICD-10-CM | POA: Insufficient documentation

## 2022-08-19 DIAGNOSIS — Z1231 Encounter for screening mammogram for malignant neoplasm of breast: Secondary | ICD-10-CM | POA: Insufficient documentation

## 2022-08-25 ENCOUNTER — Other Ambulatory Visit (HOSPITAL_COMMUNITY)
Admission: RE | Admit: 2022-08-25 | Discharge: 2022-08-25 | Disposition: A | Discharge status: Home / Self Care | Payer: 59 | Source: Ambulatory Visit | Attending: Obstetrics and Gynecology | Admitting: Obstetrics and Gynecology

## 2022-08-25 ENCOUNTER — Ambulatory Visit (INDEPENDENT_AMBULATORY_CARE_PROVIDER_SITE_OTHER): Payer: 59

## 2022-08-25 VITALS — BP 118/78 | Ht 63.0 in | Wt 359.6 lb

## 2022-08-25 DIAGNOSIS — Z113 Encounter for screening for infections with a predominantly sexual mode of transmission: Secondary | ICD-10-CM

## 2022-08-25 DIAGNOSIS — A599 Trichomoniasis, unspecified: Secondary | ICD-10-CM

## 2022-08-25 NOTE — Progress Notes (Signed)
    NURSE VISIT NOTE  Subjective:    Patient ID: Claudia Hoffman, female    DOB: [birth year], 57 y.o.   MRN: [id number]  HPI  Patient is a 57 y.o. G50P0010 female who presents for a TOC. She reports no signs or symptoms at this time.    Objective:    Ht 5\' 3"  (1.6 m)   Wt (!) 359 lb 9.6 oz (163.1 kg)   LMP  (LMP Unknown)   BMI 63.70 kg/m      Assessment:   1. Screening for STD (sexually transmitted disease)       Plan:   GC and chlamydia DNA  probe sent to lab. Treatment: Await lab results for further treatment. ROV prn if symptoms persist or worsen.   Loman Chroman, CMA

## 2022-08-27 LAB — CERVICOVAGINAL ANCILLARY ONLY
Bacterial Vaginitis (gardnerella): NEGATIVE
Candida Glabrata: NEGATIVE
Candida Vaginitis: NEGATIVE
Chlamydia: NEGATIVE
Comment: NEGATIVE
Comment: NEGATIVE
Comment: NEGATIVE
Comment: NEGATIVE
Comment: NEGATIVE
Comment: NORMAL
Neisseria Gonorrhea: NEGATIVE
Trichomonas: NEGATIVE

## 2022-12-20 LAB — EXTERNAL GENERIC LAB PROCEDURE: COLOGUARD: NEGATIVE
